# Patient Record
Sex: Female | Born: 1935 | Race: White | Hispanic: No | Marital: Single | Smoking: Never smoker
Health system: Southern US, Community
[De-identification: ages and names within clinical notes are randomized; demographics above are authoritative.]

## PROBLEM LIST (undated history)

## (undated) DIAGNOSIS — I10 Essential (primary) hypertension: Secondary | ICD-10-CM

## (undated) DIAGNOSIS — F329 Major depressive disorder, single episode, unspecified: Secondary | ICD-10-CM

## (undated) DIAGNOSIS — K559 Vascular disorder of intestine, unspecified: Secondary | ICD-10-CM

## (undated) DIAGNOSIS — I350 Nonrheumatic aortic (valve) stenosis: Secondary | ICD-10-CM

## (undated) DIAGNOSIS — F32A Depression, unspecified: Secondary | ICD-10-CM

## (undated) DIAGNOSIS — R55 Syncope and collapse: Secondary | ICD-10-CM

---

## 2005-12-04 ENCOUNTER — Ambulatory Visit: Payer: Self-pay | Admitting: Unknown Physician Specialty

## 2005-12-08 ENCOUNTER — Other Ambulatory Visit: Payer: Self-pay

## 2005-12-09 ENCOUNTER — Inpatient Hospital Stay: Payer: Self-pay | Admitting: Unknown Physician Specialty

## 2006-11-06 ENCOUNTER — Other Ambulatory Visit: Payer: Self-pay

## 2006-11-06 ENCOUNTER — Inpatient Hospital Stay: Payer: Self-pay | Admitting: Internal Medicine

## 2008-08-15 ENCOUNTER — Ambulatory Visit: Payer: Self-pay | Admitting: Family Medicine

## 2011-02-04 ENCOUNTER — Ambulatory Visit: Payer: Self-pay | Admitting: Family Medicine

## 2012-07-28 ENCOUNTER — Emergency Department: Payer: Self-pay | Admitting: Emergency Medicine

## 2013-06-29 ENCOUNTER — Emergency Department: Payer: Self-pay | Admitting: Emergency Medicine

## 2013-06-29 LAB — CBC
HGB: 13.1 g/dL (ref 12.0–16.0)
MCH: 31.9 pg (ref 26.0–34.0)
MCHC: 34.7 g/dL (ref 32.0–36.0)
MCV: 92 fL (ref 80–100)
RBC: 4.1 10*6/uL (ref 3.80–5.20)
WBC: 8.3 10*3/uL (ref 3.6–11.0)

## 2013-06-29 LAB — COMPREHENSIVE METABOLIC PANEL
Albumin: 3.2 g/dL — ABNORMAL LOW (ref 3.4–5.0)
Alkaline Phosphatase: 61 U/L (ref 50–136)
Anion Gap: 6 — ABNORMAL LOW (ref 7–16)
BUN: 19 mg/dL — ABNORMAL HIGH (ref 7–18)
Calcium, Total: 9.1 mg/dL (ref 8.5–10.1)
Chloride: 103 mmol/L (ref 98–107)
Co2: 24 mmol/L (ref 21–32)
Creatinine: 0.43 mg/dL — ABNORMAL LOW (ref 0.60–1.30)
EGFR (African American): >60
Glucose: 108 mg/dL — ABNORMAL HIGH (ref 65–99)
SGOT(AST): 22 U/L (ref 15–37)
SGPT (ALT): 19 U/L (ref 12–78)
Sodium: 133 mmol/L — ABNORMAL LOW (ref 136–145)

## 2013-06-29 LAB — URINALYSIS, COMPLETE
Bacteria: NONE SEEN
Glucose,UR: NEGATIVE mg/dL (ref 0–75)
Ph: 5 (ref 4.5–8.0)
Protein: NEGATIVE
WBC UR: <1 /HPF (ref 0–5)

## 2014-05-18 ENCOUNTER — Observation Stay: Payer: Self-pay | Admitting: Internal Medicine

## 2014-05-18 LAB — BASIC METABOLIC PANEL
ANION GAP: 4 — AB (ref 7–16)
BUN: 31 mg/dL — ABNORMAL HIGH (ref 7–18)
CREATININE: 0.66 mg/dL (ref 0.60–1.30)
Calcium, Total: 9.1 mg/dL (ref 8.5–10.1)
Chloride: 111 mmol/L — ABNORMAL HIGH (ref 98–107)
Co2: 26 mmol/L (ref 21–32)
EGFR (Non-African Amer.): >60
Glucose: 114 mg/dL — ABNORMAL HIGH (ref 65–99)
Osmolality: 289 (ref 275–301)
Potassium: 4.3 mmol/L (ref 3.5–5.1)
Sodium: 141 mmol/L (ref 136–145)

## 2014-05-18 LAB — CBC WITH DIFFERENTIAL/PLATELET
BASOS ABS: 0 10*3/uL (ref 0.0–0.1)
Basophil %: 0.6 %
Eosinophil #: 0.2 10*3/uL (ref 0.0–0.7)
Eosinophil %: 2.9 %
HCT: 40.2 % (ref 35.0–47.0)
HGB: 13.2 g/dL (ref 12.0–16.0)
LYMPHS ABS: 0.7 10*3/uL — AB (ref 1.0–3.6)
LYMPHS PCT: 12.2 %
MCH: 31.5 pg (ref 26.0–34.0)
MCHC: 32.9 g/dL (ref 32.0–36.0)
MCV: 96 fL (ref 80–100)
MONO ABS: 0.5 x10 3/mm (ref 0.2–0.9)
Monocyte %: 9.3 %
NEUTROS ABS: 4 10*3/uL (ref 1.4–6.5)
Neutrophil %: 75 %
Platelet: 156 10*3/uL (ref 150–440)
RBC: 4.2 10*6/uL (ref 3.80–5.20)
RDW: 13.6 % (ref 11.5–14.5)
WBC: 5.4 10*3/uL (ref 3.6–11.0)

## 2014-05-18 LAB — URINALYSIS, COMPLETE
Bilirubin,UR: NEGATIVE
Blood: NEGATIVE
Glucose,UR: NEGATIVE mg/dL (ref 0–75)
KETONE: NEGATIVE
Nitrite: NEGATIVE
Ph: 5 (ref 4.5–8.0)
Protein: NEGATIVE
Specific Gravity: 1.019 (ref 1.003–1.030)
Squamous Epithelial: 1
WBC UR: 1 /HPF (ref 0–5)

## 2014-05-18 LAB — TROPONIN I
Troponin-I: <0.02 ng/mL
Troponin-I: <0.02 ng/mL

## 2014-05-18 LAB — CK TOTAL AND CKMB (NOT AT ARMC)
CK, Total: 56 U/L
CK, Total: 41 U/L
CK-MB: 2.1 ng/mL (ref 0.5–3.6)
CK-MB: 1.7 ng/mL (ref 0.5–3.6)

## 2014-05-19 LAB — CBC WITH DIFFERENTIAL/PLATELET
Basophil #: 0 10*3/uL (ref 0.0–0.1)
Basophil %: 0.6 %
EOS ABS: 0.2 10*3/uL (ref 0.0–0.7)
EOS PCT: 3.9 %
HCT: 35.4 % (ref 35.0–47.0)
HGB: 11.8 g/dL — ABNORMAL LOW (ref 12.0–16.0)
LYMPHS ABS: 0.8 10*3/uL — AB (ref 1.0–3.6)
Lymphocyte %: 15.2 %
MCH: 31.8 pg (ref 26.0–34.0)
MCHC: 33.2 g/dL (ref 32.0–36.0)
MCV: 96 fL (ref 80–100)
MONOS PCT: 10.1 %
Monocyte #: 0.5 x10 3/mm (ref 0.2–0.9)
Neutrophil #: 3.5 10*3/uL (ref 1.4–6.5)
Neutrophil %: 70.2 %
Platelet: 129 10*3/uL — ABNORMAL LOW (ref 150–440)
RBC: 3.7 10*6/uL — AB (ref 3.80–5.20)
RDW: 13.1 % (ref 11.5–14.5)
WBC: 5 10*3/uL (ref 3.6–11.0)

## 2014-05-19 LAB — BASIC METABOLIC PANEL
ANION GAP: 1 — AB (ref 7–16)
BUN: 28 mg/dL — AB (ref 7–18)
CHLORIDE: 114 mmol/L — AB (ref 98–107)
CO2: 27 mmol/L (ref 21–32)
Calcium, Total: 8.5 mg/dL (ref 8.5–10.1)
Creatinine: 0.6 mg/dL (ref 0.60–1.30)
Glucose: 82 mg/dL (ref 65–99)
Osmolality: 288 (ref 275–301)
POTASSIUM: 4.1 mmol/L (ref 3.5–5.1)
Sodium: 142 mmol/L (ref 136–145)

## 2014-05-19 LAB — CK TOTAL AND CKMB (NOT AT ARMC)
CK, Total: 36 U/L
CK-MB: 1.9 ng/mL (ref 0.5–3.6)

## 2014-05-19 LAB — TROPONIN I: Troponin-I: <0.02 ng/mL

## 2014-08-23 ENCOUNTER — Ambulatory Visit: Payer: Self-pay | Admitting: Ophthalmology

## 2014-08-23 LAB — HEMOGLOBIN: HGB: 12.8 g/dL (ref 12.0–16.0)

## 2014-09-04 ENCOUNTER — Ambulatory Visit: Payer: Self-pay | Admitting: Ophthalmology

## 2014-09-14 ENCOUNTER — Observation Stay: Payer: Self-pay | Admitting: Internal Medicine

## 2014-09-14 LAB — TROPONIN I: Troponin-I: <0.02 ng/mL

## 2014-09-14 LAB — COMPREHENSIVE METABOLIC PANEL
ALBUMIN: 3.3 g/dL — AB (ref 3.4–5.0)
ANION GAP: 7 (ref 7–16)
AST: 31 U/L (ref 15–37)
Alkaline Phosphatase: 51 U/L
BUN: 22 mg/dL — ABNORMAL HIGH (ref 7–18)
Bilirubin,Total: 0.5 mg/dL (ref 0.2–1.0)
CO2: 26 mmol/L (ref 21–32)
Calcium, Total: 8.8 mg/dL (ref 8.5–10.1)
Chloride: 109 mmol/L — ABNORMAL HIGH (ref 98–107)
Creatinine: 0.37 mg/dL — ABNORMAL LOW (ref 0.60–1.30)
EGFR (African American): >60
Glucose: 85 mg/dL (ref 65–99)
Osmolality: 286 (ref 275–301)
Potassium: 4.3 mmol/L (ref 3.5–5.1)
SGPT (ALT): 35 U/L
Sodium: 142 mmol/L (ref 136–145)
TOTAL PROTEIN: 6 g/dL — AB (ref 6.4–8.2)

## 2014-09-14 LAB — CBC
HCT: 41.4 % (ref 35.0–47.0)
HGB: 13.6 g/dL (ref 12.0–16.0)
MCH: 31.6 pg (ref 26.0–34.0)
MCHC: 32.8 g/dL (ref 32.0–36.0)
MCV: 96 fL (ref 80–100)
PLATELETS: 175 10*3/uL (ref 150–440)
RBC: 4.29 10*6/uL (ref 3.80–5.20)
RDW: 13.6 % (ref 11.5–14.5)
WBC: 4.9 10*3/uL (ref 3.6–11.0)

## 2014-09-14 LAB — CK TOTAL AND CKMB (NOT AT ARMC)
CK, Total: 37 U/L
CK-MB: 1.9 ng/mL (ref 0.5–3.6)

## 2014-09-15 LAB — BASIC METABOLIC PANEL
Anion Gap: 4 — ABNORMAL LOW (ref 7–16)
BUN: 20 mg/dL — ABNORMAL HIGH (ref 7–18)
CHLORIDE: 111 mmol/L — AB (ref 98–107)
CO2: 29 mmol/L (ref 21–32)
Calcium, Total: 8 mg/dL — ABNORMAL LOW (ref 8.5–10.1)
Creatinine: 0.48 mg/dL — ABNORMAL LOW (ref 0.60–1.30)
EGFR (Non-African Amer.): >60
Glucose: 73 mg/dL (ref 65–99)
OSMOLALITY: 288 (ref 275–301)
POTASSIUM: 3.8 mmol/L (ref 3.5–5.1)
Sodium: 144 mmol/L (ref 136–145)

## 2014-09-15 LAB — TROPONIN I: Troponin-I: 0.02 ng/mL

## 2014-09-15 LAB — MAGNESIUM: Magnesium: 1.8 mg/dL

## 2015-02-24 NOTE — H&P (Signed)
PATIENT NAME:  Lisa Cantu, Lisa Cantu MR#:  [id number] DATE OF BIRTH:  [birth year]  DATE OF ADMISSION:  09/14/2014  PRIMARY CARE PHYSICIAN: Dr. Yates Decamp.  REFERRING PHYSICIAN: Loraine Leriche R. Fanny Bien, MD.   CHIEF COMPLAINT: Chest pain today.   HISTORY OF PRESENT ILLNESS: A 81 year old, Caucasian female with a history of hypertension and aortic stenosis who presented to the ED with chest pain this morning. The patient is alert, awake, oriented, in no acute distress. The patient started to have chest pain early in the morning which was sharp with no radiation, about 8/10. The patient denies any palpitations, orthopnea or nocturnal dyspnea. No leg edema. The patient denies any cough, shortness of breath or syncope episode.   PAST MEDICAL HISTORY: Childhood polio, osteoporosis, hypertension, depression, ischemic colitis, severe aortic stenosis.   PAST SURGICAL HISTORY: Left hip replacement, tonsillectomy, hysterectomy, appendectomy, kyphoplasty.   SOCIAL HISTORY: No smoking, drinking or illicit drugs.   FAMILY HISTORY: Mother and brother had heart disease. Father had cancer.   ALLERGIES: CODEINE, PENICILLIN, MERCURY.  HOME MEDICATIONS: Irbesartan  po daily, ferrous sulfate 325 mg p.o. at bedtime, escitalopram 10 mg p.o. daily, aspirin 81 mg p.o. daily, alendronate 10 mg p.o. once a week, acetaminophen extended release 2 tablets every 8 hours p.r.n.   REVIEW OF SYSTEMS:   CONSTITUTIONAL: The patient denies any fever or chills. No headache or dizziness. No weakness.   EYES: No double vision or blurred vision.   ENT: No postnasal drip, slurred speech or dysphagia.   CARDIOVASCULAR: Positive for chest pain. No palpitations, orthopnea or nocturnal dyspnea. No leg edema.   PULMONARY: No cough, sputum, shortness of breath or hematemesis.   GASTROINTESTINAL: No abdominal pain, nausea, vomiting, diarrhea. No melena or bloody stool.   GENITOURINARY: No dysuria, hematuria or incontinence.   SKIN: No  rash or jaundice.   NEUROLOGY: No syncope, loss of consciousness or seizure.   ENDOCRINOLOGY: No polyuria, polydipsia, heat or cold intolerance.   HEMATOLOGIC: No easy bruising or bleeding.   PHYSICAL EXAMINATION:  VITAL SIGNS: Temperature 98, blood pressure 124/106, pulse 56, respirations 18, oxygen saturation 98% on room air.   GENERAL: The patient is alert, awake, oriented, in no acute distress.   HEENT: Pupils round, equal and reactive to light and accommodation. Moist oral mucosa. Clear oropharynx.   NECK: Supple. No JVD or carotid bruit. No lymphadenopathy. No thyromegaly.   CARDIOVASCULAR: S1 and S2. Regular rate, rhythm. Systolic murmur about 3 to 4/6.  No gallop.  PULMONARY: Bilateral air entry. No wheezing or rales. No use of accessory muscles to breathe.   ABDOMEN: Soft. No distention or tenderness. No organomegaly. Bowel sounds present.   EXTREMITIES: No edema, clubbing or cyanosis. No calf tenderness. Bilateral pedal pulses present.   SKIN: No rash or jaundice.   NEUROLOGIC: A and O x 3. No focal deficit. Power 3 to 4/5. Sensation intact.   DIAGNOSTIC DATA: Chest x-ray: No acute cardiopulmonary abnormality. EKG showed sinus bradycardia with PACs in a pattern of bigeminy. Left bundle branch block.   LABORATORY DATA: Troponin less than 0.02. CBC in normal range. Glucose 85, BUN 22, creatinine 0.37. Electrolytes normal.   IMPRESSIONS: 1. Chest pain, need to rule out acute coronary syndrome.  2. History of severe aortic stenosis.  3. Hypertension.  4. Dehydration.   PLAN OF TREATMENT: 1. The patient will be placed for observation. We will continue to monitor, follow up troponin level and increase aspirin to 325 mg p.o. daily. We will get a cardiology consult  from Dr. Darrold JunkerParaschos.  2. For dehydration, we will give gentle rehydration and follow up BMP.  3. For hypertension, we will continue irbesartan.   4. I discussed the patient's condition and plan of treatment  with the patient.   CODE STATUS: The patient wants full code.   TIME SPENT: About 52 minutes.    ____________________________ Shaune PollackQing Clytee Heinrich, MD qc:TT D: 09/14/2014 19:26:51 ET T: 09/14/2014 19:54:57 ET JOB#: [id number]  cc: Shaune PollackQing Bentley Haralson, MD, <Dictator> Shaune PollackQING Marlayna Bannister MD ELECTRONICALLY SIGNED 09/15/2014 17:04

## 2015-02-24 NOTE — Discharge Summary (Signed)
PATIENT NAME:  Lisa Cantu, Lisa Cantu MR#:  [id number] DATE OF BIRTH:  10/29/1920  DATE OF ADMISSION:  09/14/2014 DATE OF DISCHARGE:  09/15/2014  DISCHARGE DIAGNOSES: 1.  Angina, due to severe aortic stenosis.  2.  Osteoporosis.  3.  Hypertension.  4.  Depression.  5.  Ischemic colitis.   DISCHARGE MEDICATIONS: Irbesartan 75 mg b.i.d., ferrous sulfate 325 mg daily, Lexapro 10 mg daily, aspirin 81 mg daily, alendronate 10 mg weekly, Tylenol 2 tabs q. 8 p.r.n.   REASON FOR ADMISSION: A 81 year old female who presents with chest pain. Please see H and P for HPI, past medical history and physical examination.  HOSPITAL COURSE: The patient was admitted. Enzymes were negative. The patient asymptomatic. It was thought that she was too hypotensive, causing chest pain with her AS. Her Irbesartan was decreased from 300 mg to 75 mg b.i.d. If she were to have any other symptoms, she would go to 75 mg daily, goal systolic 150/160. Follow up with Dr. Dan HumphreysWalker in 1 week.   ____________________________ Danella PentonMark F. Sandon Yoho, MD mfm:sb D: 09/15/2014 08:04:48 ET T: 09/15/2014 10:22:27 ET JOB#: [id number]  cc: Danella PentonMark F. Sherrie Marsan, MD, <Dictator> Thales Knipple Sherlene ShamsF Judyann Casasola MD ELECTRONICALLY SIGNED 09/17/2014 13:58

## 2015-02-24 NOTE — Consult Note (Signed)
Present Illness 81 year old female with history of severe aortic stenosis as well as pulmonary hypertension and tricuspid regurgitation with normal LV function and LVH by echo done recently as an outpatient.  She was admitted after suffering a syncopal episode which occurred while sitting on a cement slab.  She states she had no warning.  She was in the hot sun but states she had been eating and drinking well.  She felt not pain until she was being treated by paramedics.  She was brought to the hospital where she has ruled out for a myocardial infarction.  Brain CT scan did not reveal any significant abnormalities.  Carotid Dopplers looked normal.  Her electrolytes showed some mild dehydration.  She is currently asymptomatic on IV fluids.  Telemetry has revealed sinus arrhythmia with no tachy or bradyarrhythmias.  She denies any chest pain.  She suffered a left high a contusion.  There was no evidence of fracture. chest that revealed no acute cardiopulmonary abnormalities.   Physical Exam:  GEN thin   HEENT moist oral mucosa, Left  periorbital contusion   NECK No masses   RESP clear BS   CARD Regular rate and rhythm  Murmur   Murmur Systolic   Systolic Murmur Out flow   ABD denies tenderness   LYMPH negative neck, negative axillae   EXTR negative cyanosis/clubbing, negative edema   SKIN normal to palpation   NEURO cranial nerves intact, motor/sensory function intact   PSYCH A+O to time, place, person   Review of Systems:  Subjective/Chief Complaint left eye pain otherwise without complaints   General: No Complaints   Skin: No Complaints   ENT: No Complaints   Eyes: left periorbital contusion   Neck: No Complaints   Respiratory: No Complaints   Cardiovascular: No Complaints   Gastrointestinal: No Complaints   Genitourinary: No Complaints   Vascular: No Complaints   Musculoskeletal: No Complaints   Neurologic: No Complaints   Hematologic: No Complaints    Endocrine: No Complaints   Psychiatric: No Complaints   Review of Systems: All other systems were reviewed and found to be negative   Medications/Allergies Reviewed Medications/Allergies reviewed   Family & Social History:  Family and Social History:  Family History Non-Contributory   Social History negative tobacco   Place of Living Home   EKG:  Abnormal NSSTTW changes   Interpretation sinus arrhythmia with no ischemia    Codeine: GI Distress  Penicillin: Unknown  Mercury: Unknown   Impression 81 year old female with history of severe aortic stenosis and LV age she was admitted with a syncopal episode.  She has no evidence of arrhythmia or carotid disease.  Her episode occurred while sitting.  This did not appear to be exertional.  Does not appear that this was related to her aortic valve directly.  She may have had a tachy or bradyarrhythmia which was clinically more significant due to her fixed cardiac output.  She was also mildly volume depleted which may also have been playing a role.  There were no high-grade arrhythmias noted.  The will need to consider ambulation today.  Should she have no symptoms would consider discharge with placement of a  Holter monitor at the time of discharge with outpatient follow-up per Dr. Jamse Mead.   Plan 1. Continue with current meds 2. Ambulates later today and is stable consider discharge 3. Place 48 hr Holter monitor at time of discharge to determine if there is evidence of occult arrhythmia not noted during the hospitalization  4. Follow-up with Dr. Robbie LouisAlex's Paraschos 1 week after discharge   Electronic Signatures: Dalia HeadingFath, Maykayla Highley A (MD)  (Signed 17-Jul-15 09:00)  Authored: General Aspect/Present Illness, History and Physical Exam, Review of System, Family & Social History, EKG , Allergies, Impression/Plan   Last Updated: 17-Jul-15 09:00 by Dalia HeadingFath, Analis Distler A (MD)

## 2015-02-24 NOTE — H&P (Signed)
PATIENT NAME:  Lisa Cantu, Lisa Cantu MR#:  [id number] DATE OF BIRTH:  23-May-1920  DATE OF ADMISSION:  05/18/2014  CHIEF COMPLAINT: Syncope.   HISTORY OF PRESENT ILLNESS: Lisa Cantu is a 81 year old female, who is a patient of Dr. Yates DecampJohn Walker, who presented to the ED after she passed out. The patient reportedly was at her great niece's house by the pool, sitting on a cement ledge when she passed out. She also hit her head against the cement and sustained a laceration to the left temple and an abrasion to the left elbow. The patient denies any chest pain or shortness of breath. No dizziness. No seizures. No incontinence of urine or stool. She states that she was somewhat confused after the event. She has been having a headache mainly in the left temple area.   PAST MEDICAL HISTORY: Significant for childhood polio, osteoporosis, hypertension, depression, history of ischemic colitis, severe aortic stenosis with with a mean gradient of 36 mmHg by echocardiogram in 2012, history of elevated fasting glucose.   PAST SURGICAL HISTORY: Significant for left hip replacement, tonsillectomy, hysterectomy, appendectomy and kyphoplasty.   CURRENT MEDICATIONS: Fosamax 70 mg once a week, aspirin 81 mg a day, Irbesartan 300 mg once a day, ferrous sulfate 325 mg once a day, escitalopram 10 mg once a day.   ALLERGIES: PENICILLIN.   SOCIAL HISTORY: The patient denies any use of tobacco.   FAMILY HISTORY: Mother and brother had heart disease. Father had cancer.   SOCIAL HISTORY: The patient is unmarried, no children.   PHYSICAL EXAMINATION:  VITAL SIGNS: Temperature was 98.2, pulse was 62, blood pressure 131/60, pulse oximetry 96% on room air. Height 4 feet 11 inches.  GENERAL: She was not in distress. Laceration noted in the left temporal area, and abrasion to left elbow.  HEENT: Pupils equal and reactive to light.  NECK: No JVD.  LUNGS: Kyphotic, chest clear to auscultation.  HEART: S1, S2. A 3/6 systolic  ejection murmur noted.  ABDOMEN: Soft, nontender.  EXTREMITIES: No edema. Evidence of muscle weakness from polio noted in both lower extremities, right worse than left, and arthritic changes noted in both upper extremities.   LABORATORY DATA: WBC count 5.4, hemoglobin 13.2, hematocrit 40.2, platelets 156,000. Sugar 114, BUN 31, creatinine 0.66, potassium 4.3, chloride 111, CO2 of 26, anion gap 4. Urinalysis normal.   CT head showed mild diffuse cortical atrophy and mild chronic ischemic white matter disease. No acute intracranial abnormality seen. Old fracture involving the odontoid process is noted without significant posterior displacement or spondylolisthesis. There was multilevel degenerative disk disease. No acute abnormalities seen in the cervical spine.   EKG showed evidence of sinus bradycardia with sinus arrhythmia, possible left atrial enlargement, ST-T changes noted in the inferolateral leads with Q waves in lead III and aVF.   Troponin was less than 0.02.   IMPRESSION:  1. Syncope, may be related to aortic stenosis.  2. Dehydration with elevated BUN.  3. History of hypertension.  4. History of anxiety and depression.   PLAN: Admit the patient for observation. We will obtain a carotid duplex ultrasound. We will also cycle cardiac enzymes. Consult cardiology and gently hydrate with IV normal saline.   TOTAL TIME SPENT IN ADMISSION OF THIS PATIENT: 35 minutes.    ____________________________ Barbette ReichmannVishwanath Shakiara Lukic, MD vh:jr D: 05/18/2014 18:17:14 ET T: 05/18/2014 18:41:06 ET JOB#: [id number]  cc: Barbette ReichmannVishwanath Dequavion Follette, MD, <Dictator> Barbette ReichmannVISHWANATH Johnae Friley MD ELECTRONICALLY SIGNED 05/29/2014 18:16

## 2015-02-24 NOTE — Discharge Summary (Signed)
PATIENT NAME:  Lisa Cantu, [name] MR#:  [id number] DATE OF BIRTH:  1920-06-06  DATE OF ADMISSION:  05/18/2014 DATE OF DISCHARGE:  05/19/2014  DISCHARGE DIAGNOSES: 1.  Syncope.  2.  Severe aortic stenosis.  3.  Fall with abrasion to left elbow. 4.  Laceration to left temple.  5.  Hypertension.   CHIEF COMPLAINT: Passed out.   HISTORY OF PRESENT ILLNESS: Lisa Cantu is a 81 year old female who presented to ED after she passed out. The patient was reportedly at her great niece's house when she passed out and hit her head against the cement and sustained a laceration to the left temple and also had abrasion to left elbow.   PAST MEDICAL HISTORY: Significant for severe aortic stenosis, left hip replacement, tonsillectomy, hysterectomy, appendectomy and kyphoplasty.   HOSPITAL COURSE: [facility] and observed overnight. She received intravenous fluids. She also underwent an echocardiogram and a carotid ultrasound.  Carotid ultrasound did not show any hemodynamically significant stenosis. There was area of relatively mild plaque bilaterally slightly more left than the right. No stenosis approaching 50% was seen by real-time interrogation on either side. Flow in both vertebral arteries in the direction.   Echocardiogram showed ejection fraction of 55% to 60%, normal global LV systolic function, moderate concentric LVH, moderately increased left ventricular septal thickness, moderate thickening and calcification anterior and posterior mitral valve, severe aortic valve stenosis, mild to moderate tricuspid regurgitation and moderate increased LV posterior wall thickness. The patient was stable during her stay in the hospital and did not have any further episodes of syncope. She was ambulated and appeared stable and was discharged on the following medications. She was advised to have a Holter monitor test placed and also follow with Dr. Darrold JunkerParaschos in 1 week.   DISCHARGE MEDICATIONS:   Escitalopram 10 mg once a day, ferrous sulfate 325, one tablet once a day, irbesartan 300 mg once a day, aspirin 81 mg a day, Fosamax 70 mg once a week.   FOLLOWUP: The patient has been advised to follow up with Dr. Dan HumphreysWalker in 1-2 weeks' time. She was advised to call back or report to the ER if she has any further episodes. She was stable at the time of discharge.   TOTAL TIME SPENT ON DISCHARGING PATIENT: 35 minutes.    ____________________________ Barbette ReichmannVishwanath Symphany Fleissner, MD vh:ds D: 05/19/2014 15:55:58 ET T: 05/19/2014 22:50:02 ET JOB#: [id number]  cc: Barbette ReichmannVishwanath Maneh Sieben, MD, <Dictator> Barbette ReichmannVISHWANATH Claris Pech MD ELECTRONICALLY SIGNED 05/29/2014 18:16

## 2015-02-24 NOTE — Op Note (Signed)
PATIENT NAME:  Lisa Cantu, Lisa Cantu MR#:  [id number] DATE OF BIRTH:  [birth year]  DATE OF PROCEDURE:  09/04/2014  PREOPERATIVE DIAGNOSIS: Cataract, left eye.   POSTOPERATIVE DIAGNOSIS: Cataract, left eye.   PROCEDURE PERFORMED: Extracapsular cataract extraction using phacoemulsification with placement Alcon [id number], 21.0 diopter posterior chamber lens, serial number [id number].   SURGEON: Maylon PeppersSteven A. Dewey Viens, MD  ASSISTANT:  None.   ANESTHESIA: Was 4% lidocaine and 0.75% Marcaine, a 50-50 mixture with 10 units/mL of Hylenex added, given as a peribulbar.  ANESTHESIOLOGIST: Yves DillPaul Carroll, MD   COMPLICATIONS: None.   DESCRIPTION OF PROCEDURE:  The patient was brought to the operating room and given a peribulbar block.  The patient was then prepped and draped in the usual fashion.  The vertical rectus muscles were imbricated using 5-0 silk sutures.  These sutures were then clamped to the sterile drapes as bridle sutures.  A limbal peritomy was performed extending two clock hours and hemostasis was obtained with cautery.  A partial thickness scleral groove was made at the surgical limbus and dissected anteriorly in a lamellar dissection using an Alcon crescent knife.  The anterior chamber was entered superonasally with a Superblade and through the lamellar dissection with a 2.6 mm keratome.  DisCoVisc was used to replace the aqueous and a continuous tear capsulorrhexis was carried out.  Hydrodissection and hydrodelineation were carried out with balanced salt and a 27 gauge canula.  The nucleus was rotated to confirm the effectiveness of the hydrodissection.  Phacoemulsification was carried out using a divide-and-conquer technique.  Total ultrasound time was 1 minute and 13 seconds with an average power of 23.8%, with CDE of 30.49. No sutures placed.  Irrigation/aspiration was used to remove the residual cortex.  DisCoVisc was used to inflate the capsule and the internal incision was enlarged to 3 mm  with the crescent knife.  The intraocular lens was folded and inserted into the capsular bag using the AcrySert delivery system.   Irrigation/aspiration was used to remove the residual DisCoVisc.  Miostat was injected into the anterior chamber through the paracentesis track to inflate the anterior chamber and induce miosis. A tenth of a mL of Vigamox containing 0.1 mg of drug was injected via he paracentesis track.    The wound was checked for leaks and none were found. The conjunctiva was closed with cautery and the bridle sutures were removed.  Two drops of 0.3% Vigamox were placed on the eye.   An eye shield was placed on the eye.  The patient was discharged to the recovery room in good condition.    ____________________________ Maylon PeppersSteven A. Deondre Marinaro, MD sad:ts D: 09/04/2014 13:26:18 ET T: 09/04/2014 22:04:02 ET JOB#: [id number]  cc: Viviann SpareSteven A. Jaleil Renwick, MD, <Dictator> Erline LevineSTEVEN A Gimena Buick MD ELECTRONICALLY SIGNED 09/11/2014 13:25

## 2015-06-08 IMAGING — CT CT CERVICAL SPINE WITHOUT CONTRAST
3 of 4 series · 12 of 27 positions shown, 14 images · non-contrast
Comparison: CT scan of head and cervical spine July 28, 2012.

CLINICAL DATA: Head injury, syncope.

EXAM:
CT HEAD WITHOUT CONTRAST
CT CERVICAL SPINE WITHOUT CONTRAST
TECHNIQUE: Multidetector CT imaging of the head and cervical spine was
performed following the standard protocol without intravenous
contrast. Multiplanar CT image reconstructions of the cervical spine
were also generated.

[Series 3: head bone · axial · 0.41mm/px · z∈[-128,-23]mm · 5 of 106 slices shown]
[im 18/106  bone]
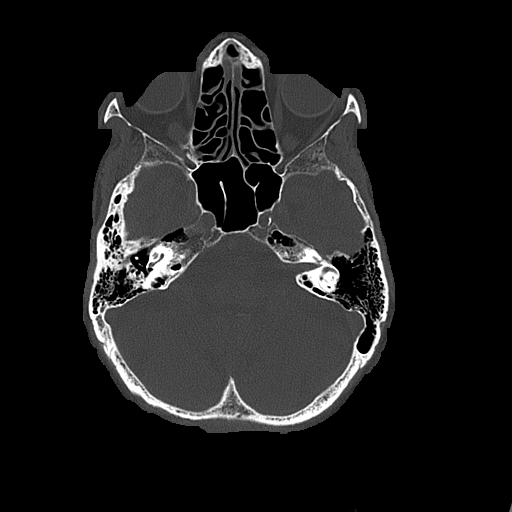
[im 36/106  bone]
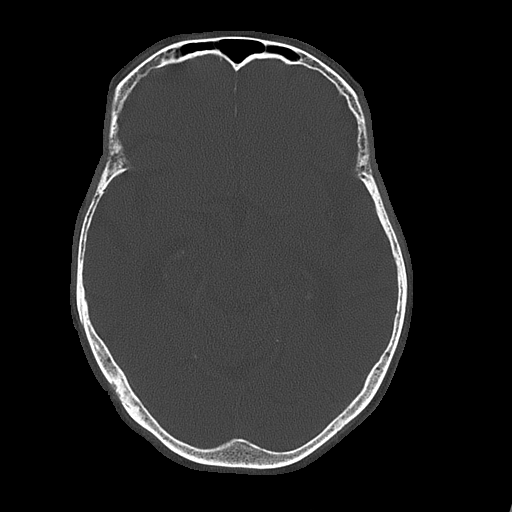
[im 53/106  bone]
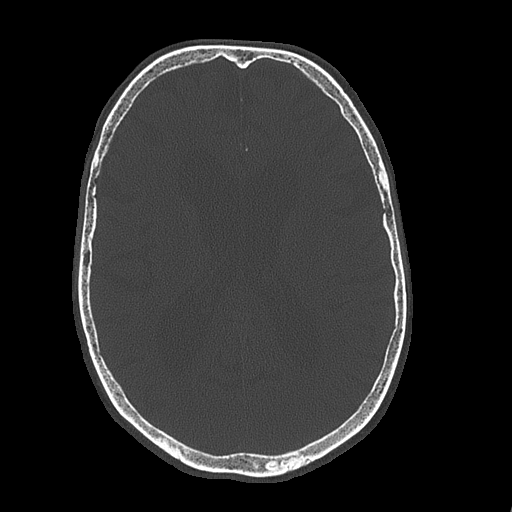
[im 71/106  bone]
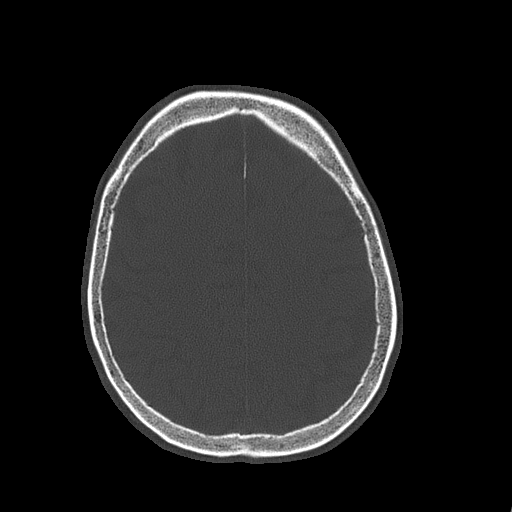
[im 88/106  bone]
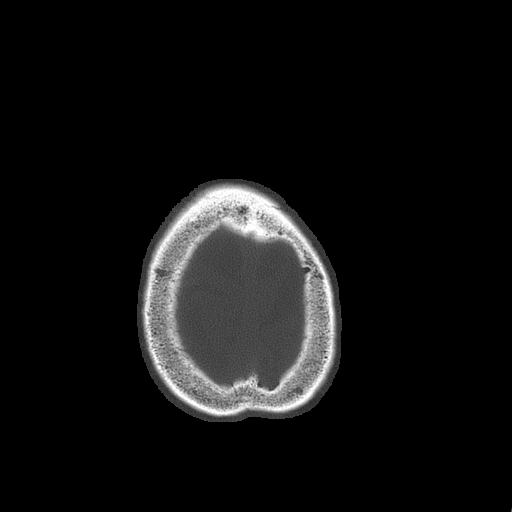

[Series 5: c spine soft · axial · 0.44mm/px · z∈[-236,-194]mm · 2 of 64 slices shown, 3 images]
[im 22/64  soft-tissue]
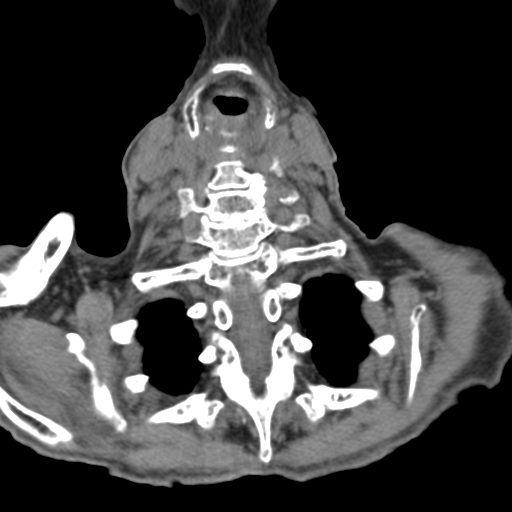
[im 22/64  bone]
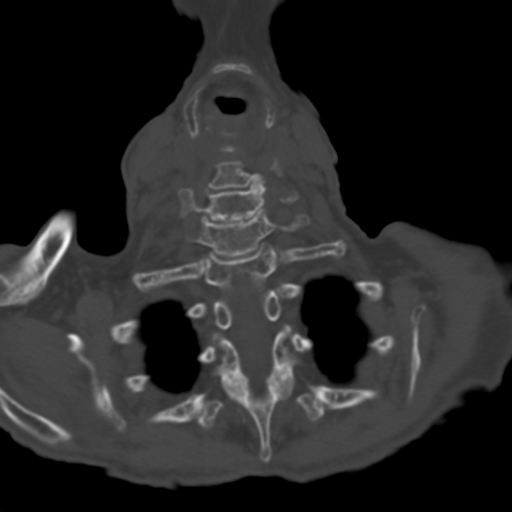
[im 43/64  bone]
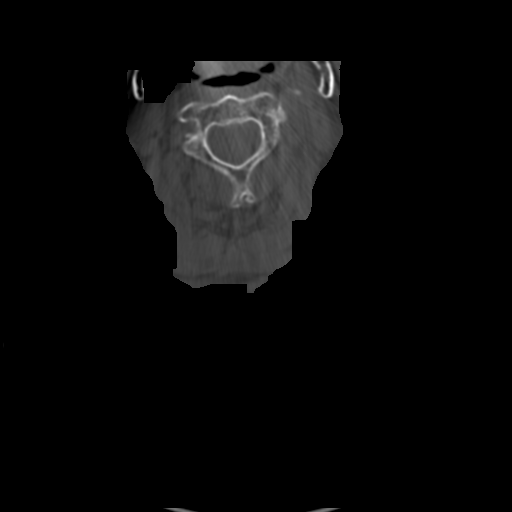

[Series 6: sag bone · sagittal · 0.36mm/px · 5 of 61 slices shown, 6 images]
[im 21/61  bone]
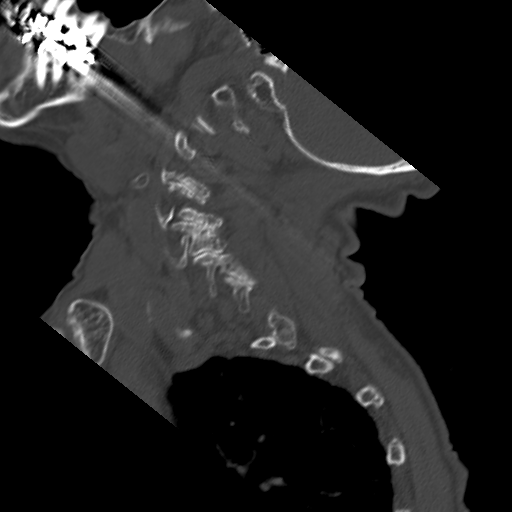
[im 26/61  bone]
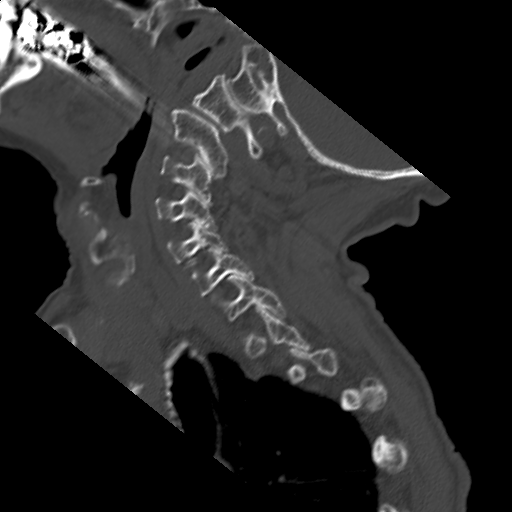
[im 31/61  soft-tissue]
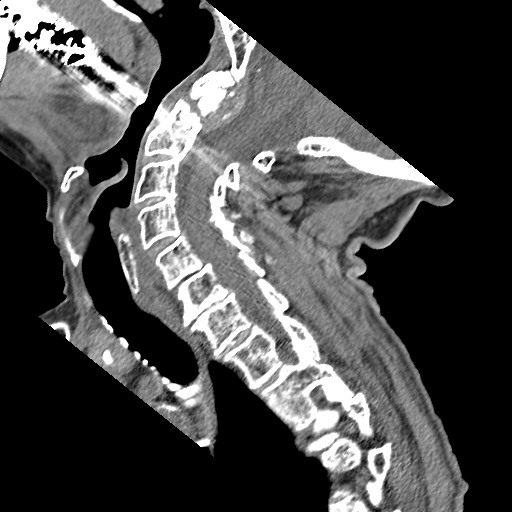
[im 31/61  bone]
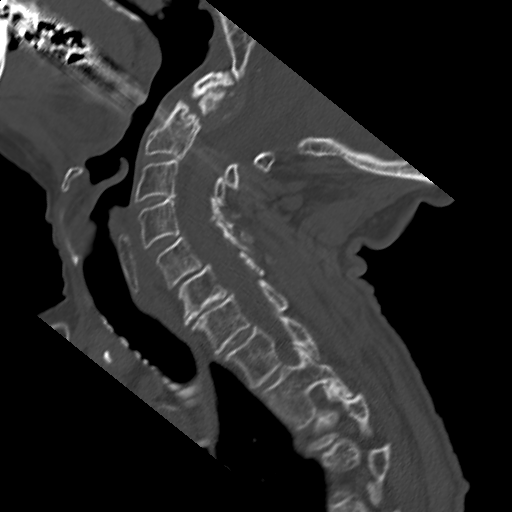
[im 36/61  bone]
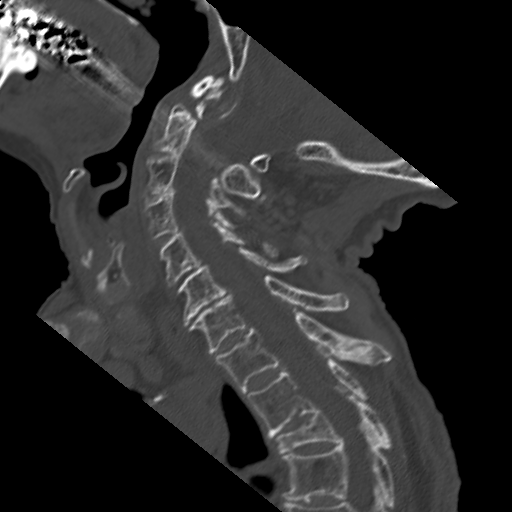
[im 41/61  bone]
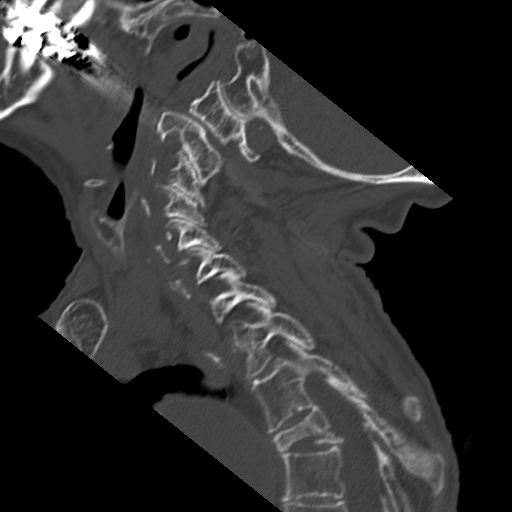

[12 of 27 positions shown; findings below may reference images not displayed]

FINDINGS: CT HEAD FINDINGS

Bony calvarium is intact. Mild diffuse cortical atrophy is noted.
Mild chronic ischemic white matter disease is noted. No mass effect
or midline shift is noted. Ventricular size is within normal limits.
There is no evidence of mass lesion, hemorrhage or acute infarction.

CT CERVICAL SPINE FINDINGS

Old fracture involving the odontoid is noted without posterior
displacement or significant spondylolisthesis. Severe degenerative
disc disease is noted at C5-6 and C6-7 with anterior osteophyte
formation. No acute fracture is noted. Degenerative changes seen
involving the posterior facet joints bilaterally and at multiple
levels.
IMPRESSION: Mild diffuse cortical atrophy. Mild chronic ischemic white matter
disease. No acute intracranial abnormality seen.

Old fracture involving the odontoid process is noted without
significant posterior displacement or spondylolisthesis. Multilevel
degenerative disc disease is noted. No acute abnormality seen in the
cervical spine.

## 2015-09-18 ENCOUNTER — Emergency Department
Admission: EM | Admit: 2015-09-18 | Discharge: 2015-09-18 | Disposition: A | Discharge status: Home / Self Care | Payer: Medicare Other | Source: Home / Self Care | Attending: Emergency Medicine | Admitting: Emergency Medicine

## 2015-09-18 ENCOUNTER — Emergency Department: Payer: Medicare Other

## 2015-09-18 ENCOUNTER — Encounter: Payer: Self-pay | Admitting: *Deleted

## 2015-09-18 DIAGNOSIS — I214 Non-ST elevation (NSTEMI) myocardial infarction: Secondary | ICD-10-CM | POA: Diagnosis not present

## 2015-09-18 DIAGNOSIS — R531 Weakness: Secondary | ICD-10-CM

## 2015-09-18 DIAGNOSIS — J189 Pneumonia, unspecified organism: Secondary | ICD-10-CM

## 2015-09-18 HISTORY — DX: Depression, unspecified: F32.A

## 2015-09-18 HISTORY — DX: Major depressive disorder, single episode, unspecified: F32.9

## 2015-09-18 HISTORY — DX: Syncope and collapse: R55

## 2015-09-18 HISTORY — DX: Vascular disorder of intestine, unspecified: K55.9

## 2015-09-18 HISTORY — DX: Essential (primary) hypertension: I10

## 2015-09-18 HISTORY — DX: Nonrheumatic aortic (valve) stenosis: I35.0

## 2015-09-18 LAB — URINALYSIS COMPLETE WITH MICROSCOPIC (ARMC ONLY)
BACTERIA UA: NONE SEEN
Bilirubin Urine: NEGATIVE
Glucose, UA: NEGATIVE mg/dL
HGB URINE DIPSTICK: NEGATIVE
LEUKOCYTES UA: NEGATIVE
Nitrite: NEGATIVE
PH: 5 (ref 5.0–8.0)
PROTEIN: 30 mg/dL — AB
RBC / HPF: NONE SEEN RBC/hpf (ref 0–5)
Specific Gravity, Urine: 1.023 (ref 1.005–1.030)

## 2015-09-18 LAB — CBC WITH DIFFERENTIAL/PLATELET
BASOS ABS: 0 10*3/uL (ref 0–0.1)
Basophils Relative: 0 %
Eosinophils Absolute: 0.1 10*3/uL (ref 0–0.7)
Eosinophils Relative: 1 %
HEMATOCRIT: 43.4 % (ref 35.0–47.0)
Hemoglobin: 14.1 g/dL (ref 12.0–16.0)
LYMPHS PCT: 8 %
Lymphs Abs: 0.6 10*3/uL — ABNORMAL LOW (ref 1.0–3.6)
MCH: 30.8 pg (ref 26.0–34.0)
MCHC: 32.6 g/dL (ref 32.0–36.0)
MCV: 94.6 fL (ref 80.0–100.0)
MONO ABS: 0.8 10*3/uL (ref 0.2–0.9)
MONOS PCT: 10 %
NEUTROS ABS: 5.9 10*3/uL (ref 1.4–6.5)
Neutrophils Relative %: 81 %
Platelets: 171 10*3/uL (ref 150–440)
RBC: 4.59 MIL/uL (ref 3.80–5.20)
RDW: 15.1 % — AB (ref 11.5–14.5)
WBC: 7.3 10*3/uL (ref 3.6–11.0)

## 2015-09-18 LAB — COMPREHENSIVE METABOLIC PANEL
ALBUMIN: 3.5 g/dL (ref 3.5–5.0)
ALT: 39 U/L (ref 14–54)
ANION GAP: 7 (ref 5–15)
AST: 44 U/L — ABNORMAL HIGH (ref 15–41)
Alkaline Phosphatase: 45 U/L (ref 38–126)
BUN: 20 mg/dL (ref 6–20)
CHLORIDE: 106 mmol/L (ref 101–111)
CO2: 26 mmol/L (ref 22–32)
Calcium: 9.5 mg/dL (ref 8.9–10.3)
Creatinine, Ser: 0.63 mg/dL (ref 0.44–1.00)
GFR calc Af Amer: >60 mL/min (ref >60)
GFR calc non Af Amer: >60 mL/min (ref >60)
GLUCOSE: 102 mg/dL — AB (ref 65–99)
POTASSIUM: 3.8 mmol/L (ref 3.5–5.1)
SODIUM: 139 mmol/L (ref 135–145)
Total Bilirubin: 0.7 mg/dL (ref 0.3–1.2)
Total Protein: 6.1 g/dL — ABNORMAL LOW (ref 6.5–8.1)

## 2015-09-18 MED ORDER — LEVOFLOXACIN IN D5W 750 MG/150ML IV SOLN
750.0000 mg | Freq: Once | INTRAVENOUS | Status: AC
  Administered 2015-09-18: 750 mg via INTRAVENOUS
  Filled 2015-09-18: qty 150

## 2015-09-18 MED ORDER — SODIUM CHLORIDE 0.9 % IV BOLUS (SEPSIS)
500.0000 mL | Freq: Once | INTRAVENOUS | Status: AC
  Administered 2015-09-18: 500 mL via INTRAVENOUS

## 2015-09-18 MED ORDER — LEVOFLOXACIN 750 MG PO TABS: 750.0000 mg | ORAL_TABLET | Freq: Every day | ORAL | Status: DC

## 2015-09-18 NOTE — ED Provider Notes (Signed)
[facility] Emergency Department Provider Note   ____________________________________________  Time seen: 1655  I have reviewed the triage vital signs and the nursing notes.   HISTORY  Chief Complaint Weakness   History limited by: Not Limited   HPI Lisa Cantu is a 81 y.o. female who presents to the emergency department today because of concerns for weakness. The patient states that this weakness has been going on for roughly 5 weeks. It is been progressively getting worse. Were recently however the patient is concerned about some chest congestion. She has had some cough. Patient denies any shortness of breath. Denies any significant chest pain. Denies any fevers.   Past Medical History  Diagnosis Date  . Aortic stenosis   . Hypertension   . Depressed   . Syncope   . Ischemic colitis (HCC)     There are no active problems to display for this patient.   History reviewed. No pertinent past surgical history.  No current outpatient prescriptions on file.  Allergies Penicillins  No family history on file.  Social History Social History  Substance Use Topics  . Smoking status: Never Smoker   . Smokeless tobacco: None  . Alcohol Use: No    Review of Systems  Constitutional: Negative for fever. Cardiovascular: Negative for chest pain. Respiratory: Negative for shortness of breath. Gastrointestinal: Negative for abdominal pain, vomiting and diarrhea. Genitourinary: Negative for dysuria. Musculoskeletal: Negative for back pain. Skin: Negative for rash. Neurological: Negative for headaches, focal weakness or numbness.  10-point ROS otherwise negative.  ____________________________________________   PHYSICAL EXAM:  VITAL SIGNS: ED Triage Vitals  Enc Vitals Group     BP 09/18/15 1550 133/66 mmHg     Pulse Rate 09/18/15 1550 73     Resp 09/18/15 1550 20     Temp 09/18/15 1550 98.1 F (36.7 C)     Temp Source 09/18/15 1550  Oral     SpO2 09/18/15 1550 97 %     Weight 09/18/15 1550 75 lb (34.02 kg)     Height 09/18/15 1550 4\' 6"  (1.372 m)     Head Cir --      Peak Flow --      Pain Score 09/18/15 1551 5   Constitutional: Alert and oriented. Well appearing and in no distress. Eyes: Conjunctivae are normal. PERRL. Normal extraocular movements. ENT   Head: Normocephalic and atraumatic.   Nose: No congestion/rhinnorhea.   Mouth/Throat: Mucous membranes are moist.   Neck: No stridor. Hematological/Lymphatic/Immunilogical: No cervical lymphadenopathy. Cardiovascular: Normal rate, regular rhythm.  Systolic murmur grade 2/6. Respiratory: Normal respiratory effort without tachypnea nor retractions. Breath sounds are clear and equal bilaterally. No wheezes/rales/rhonchi. Gastrointestinal: Soft and nontender. No distention.  Genitourinary: Deferred Musculoskeletal: Normal range of motion in all extremities. No joint effusions.  No lower extremity tenderness nor edema. Neurologic:  Normal speech and language. No gross focal neurologic deficits are appreciated.  Skin:  Skin is warm, dry and intact. No rash noted. Psychiatric: Mood and affect are normal. Speech and behavior are normal. Patient exhibits appropriate insight and judgment.  ____________________________________________    LABS (pertinent positives/negatives)  Labs Reviewed  COMPREHENSIVE METABOLIC PANEL - Abnormal; Notable for the following:    Glucose, Bld 102 (*)    Total Protein 6.1 (*)    AST 44 (*)    All other components within normal limits  CBC WITH DIFFERENTIAL/PLATELET - Abnormal; Notable for the following:    RDW 15.1 (*)    Lymphs Abs 0.6 (*)  All other components within normal limits  URINALYSIS COMPLETEWITH MICROSCOPIC ([facility] ONLY) - Abnormal; Notable for the following:    Color, Urine YELLOW (*)    APPearance CLEAR (*)    Ketones, ur TRACE (*)    Protein, ur 30 (*)    Squamous Epithelial / LPF 0-5 (*)    All other  components within normal limits  TROPONIN I     ____________________________________________   EKG  I, Phineas Semen, attending physician, personally viewed and interpreted this EKG  EKG Time: 1617 Rate: 76 Rhythm: NSR Axis: left axis deviation Intervals: qtc 474 QRS: LBBB ST changes: no st elevation Impression: abnormal ekg ____________________________________________    RADIOLOGY  CXR IMPRESSION: Consolidation over the left lower lobe likely pneumonia. Small left effusion.  ____________________________________________   PROCEDURES  Procedure(s) performed: None  Critical Care performed: No  ____________________________________________   INITIAL IMPRESSION / ASSESSMENT AND PLAN / ED COURSE  Pertinent labs & imaging results that were available during my care of the patient were reviewed by me and considered in my medical decision making (see chart for details).  Patient presents to the emergency department today because of weakness progressively getting worse for 4-5 months and some concern of chest congestion. Chest x-ray shows a possible pneumonia. Patient however is afebrile without a leukocytosis. At this point I'm not 100% convinced that the clinical picture matches pneumonia. The patient did state she felt better after IV fluids think dehydration likely playing a large part of the patient's symptoms. Will plan however on sending home with antibiotics in case there is a pneumonia. Discussed follow-up and return precautions.  ____________________________________________   FINAL CLINICAL IMPRESSION(S) / ED DIAGNOSES  Final diagnoses:  Community acquired pneumonia  Weakness     Phineas Semen, MD 09/18/15 [phone]

## 2015-09-18 NOTE — Discharge Instructions (Signed)
Please seek medical attention for any high fevers, chest pain, shortness of breath, change in behavior, persistent vomiting, bloody stool or any other new or concerning symptoms. ° °Community-Acquired Pneumonia, Adult °Pneumonia is an infection of the lungs. There are different types of pneumonia. One type can develop while a person is in a hospital. A different type, called community-acquired pneumonia, develops in people who are not, or have not recently been, in the hospital or other health care facility.  °CAUSES °Pneumonia may be caused by bacteria, viruses, or funguses. Community-acquired pneumonia is often caused by Streptococcus pneumonia bacteria. These bacteria are often passed from one person to another by breathing in droplets from the cough or sneeze of an infected person. °RISK FACTORS °The condition is more likely to develop in: °· People who have chronic diseases, such as chronic obstructive pulmonary disease (COPD), asthma, congestive heart failure, cystic fibrosis, diabetes, or kidney disease. °· People who have early-stage or late-stage HIV. °· People who have sickle cell disease. °· People who have had their spleen removed (splenectomy). °· People who have poor dental hygiene. °· People who have medical conditions that increase the risk of breathing in (aspirating) secretions their own mouth and nose.   °· People who have a weakened immune system (immunocompromised). °· People who smoke. °· People who travel to areas where pneumonia-causing germs commonly exist. °· People who are around animal habitats or animals that have pneumonia-causing germs, including birds, bats, rabbits, cats, and farm animals. °SYMPTOMS °Symptoms of this condition include: °· A dry cough. °· A wet (productive) cough. °· Fever. °· Sweating. °· Chest pain, especially when breathing deeply or coughing. °· Rapid breathing or difficulty breathing. °· Shortness of breath. °· Shaking chills. °· Fatigue. °· Muscle  aches. °DIAGNOSIS °Your health care provider will take a medical history and perform a physical exam. You may also have other tests, including: °· Imaging studies of your chest, including X-rays. °· Tests to check your blood oxygen level and other blood gases. °· Other tests on blood, mucus (sputum), fluid around your lungs (pleural fluid), and urine. °If your pneumonia is severe, other tests may be done to identify the specific cause of your illness. °TREATMENT °The type of treatment that you receive depends on many factors, such as the cause of your pneumonia, the medicines you take, and other medical conditions that you have. For most adults, treatment and recovery from pneumonia may occur at home. In some cases, treatment must happen in a hospital. Treatment may include: °· Antibiotic medicines, if the pneumonia was caused by bacteria. °· Antiviral medicines, if the pneumonia was caused by a virus. °· Medicines that are given by mouth or through an IV tube. °· Oxygen. °· Respiratory therapy. °Although rare, treating severe pneumonia may include: °· Mechanical ventilation. This is done if you are not breathing well on your own and you cannot maintain a safe blood oxygen level. °· Thoracentesis. This procedure removes fluid around one lung or both lungs to help you breathe better. °HOME CARE INSTRUCTIONS °· Take over-the-counter and prescription medicines only as told by your health care provider. °¨ Only take cough medicine if you are losing sleep. Understand that cough medicine can prevent your body's natural ability to remove mucus from your lungs. °¨ If you were prescribed an antibiotic medicine, take it as told by your health care provider. Do not stop taking the antibiotic even if you start to feel better. °· Sleep in a semi-upright position at night. Try sleeping in a reclining chair, or place a few pillows under your head. °· Do   not use tobacco products, including cigarettes, chewing tobacco, and  e-cigarettes. If you need help quitting, ask your health care provider. °· Drink enough water to keep your urine clear or pale yellow. This will help to thin out mucus secretions in your lungs. °PREVENTION °There are ways that you can decrease your risk of developing community-acquired pneumonia. Consider getting a pneumococcal vaccine if: °· You are older than 79 years of age. °· You are older than 79 years of age and are undergoing cancer treatment, have chronic lung disease, or have other medical conditions that affect your immune system. Ask your health care provider if this applies to you. °There are different types and schedules of pneumococcal vaccines. Ask your health care provider which vaccination option is best for you. °You may also prevent community-acquired pneumonia if you take these actions: °· Get an influenza vaccine every year. Ask your health care provider which type of influenza vaccine is best for you. °· Go to the dentist on a regular basis. °· Wash your hands often. Use hand sanitizer if soap and water are not available. °SEEK MEDICAL CARE IF: °· You have a fever. °· You are losing sleep because you cannot control your cough with cough medicine. °SEEK IMMEDIATE MEDICAL CARE IF: °· You have worsening shortness of breath. °· You have increased chest pain. °· Your sickness becomes worse, especially if you are an older adult or have a weakened immune system. °· You cough up blood. °  °This information is not intended to replace advice given to you by your health care provider. Make sure you discuss any questions you have with your health care provider. °  °Document Released: 10/20/2005 Document Revised: 07/11/2015 Document Reviewed: 02/14/2015 °Elsevier Interactive Patient Education ©2016 [employer] ° °

## 2015-09-18 NOTE — ED Notes (Signed)
Pt medication not stocked in ED pyxis.  Called pharmacy to send medication.

## 2015-09-18 NOTE — ED Notes (Signed)
Head chest congestion, black stools, saw md yesterday , is on iron, no rx, no dx

## 2015-09-19 ENCOUNTER — Inpatient Hospital Stay
Admission: EM | Admit: 2015-09-19 | Discharge: 2015-09-21 | DRG: 280 | Disposition: A | Discharge status: Hospice | Payer: Medicare Other | Attending: Internal Medicine | Admitting: Internal Medicine

## 2015-09-19 ENCOUNTER — Emergency Department: Payer: Medicare Other

## 2015-09-19 ENCOUNTER — Encounter: Payer: Self-pay | Admitting: Radiology

## 2015-09-19 DIAGNOSIS — A419 Sepsis, unspecified organism: Secondary | ICD-10-CM | POA: Diagnosis present

## 2015-09-19 DIAGNOSIS — I5022 Chronic systolic (congestive) heart failure: Secondary | ICD-10-CM | POA: Diagnosis present

## 2015-09-19 DIAGNOSIS — Y95 Nosocomial condition: Secondary | ICD-10-CM | POA: Diagnosis present

## 2015-09-19 DIAGNOSIS — R7989 Other specified abnormal findings of blood chemistry: Secondary | ICD-10-CM

## 2015-09-19 DIAGNOSIS — I110 Hypertensive heart disease with heart failure: Secondary | ICD-10-CM | POA: Diagnosis present

## 2015-09-19 DIAGNOSIS — I350 Nonrheumatic aortic (valve) stenosis: Secondary | ICD-10-CM | POA: Diagnosis present

## 2015-09-19 DIAGNOSIS — I214 Non-ST elevation (NSTEMI) myocardial infarction: Principal | ICD-10-CM | POA: Diagnosis present

## 2015-09-19 DIAGNOSIS — J189 Pneumonia, unspecified organism: Secondary | ICD-10-CM | POA: Diagnosis present

## 2015-09-19 DIAGNOSIS — M4850XA Collapsed vertebra, not elsewhere classified, site unspecified, initial encounter for fracture: Secondary | ICD-10-CM | POA: Diagnosis present

## 2015-09-19 DIAGNOSIS — I447 Left bundle-branch block, unspecified: Secondary | ICD-10-CM | POA: Diagnosis present

## 2015-09-19 DIAGNOSIS — Z880 Allergy status to penicillin: Secondary | ICD-10-CM

## 2015-09-19 DIAGNOSIS — E872 Acidosis: Secondary | ICD-10-CM | POA: Diagnosis present

## 2015-09-19 DIAGNOSIS — Z888 Allergy status to other drugs, medicaments and biological substances status: Secondary | ICD-10-CM | POA: Diagnosis not present

## 2015-09-19 DIAGNOSIS — I248 Other forms of acute ischemic heart disease: Secondary | ICD-10-CM | POA: Diagnosis present

## 2015-09-19 DIAGNOSIS — Z66 Do not resuscitate: Secondary | ICD-10-CM | POA: Diagnosis present

## 2015-09-19 DIAGNOSIS — Z515 Encounter for palliative care: Secondary | ICD-10-CM | POA: Diagnosis present

## 2015-09-19 DIAGNOSIS — R778 Other specified abnormalities of plasma proteins: Secondary | ICD-10-CM

## 2015-09-19 DIAGNOSIS — I509 Heart failure, unspecified: Secondary | ICD-10-CM

## 2015-09-19 LAB — LACTIC ACID, PLASMA: LACTIC ACID, VENOUS: 3.9 mmol/L — AB (ref 0.5–2.0)

## 2015-09-19 LAB — COMPREHENSIVE METABOLIC PANEL
ALT: 73 U/L — ABNORMAL HIGH (ref 14–54)
ANION GAP: 11 (ref 5–15)
AST: 131 U/L — ABNORMAL HIGH (ref 15–41)
Albumin: 3.7 g/dL (ref 3.5–5.0)
Alkaline Phosphatase: 42 U/L (ref 38–126)
BUN: 28 mg/dL — ABNORMAL HIGH (ref 6–20)
CHLORIDE: 107 mmol/L (ref 101–111)
CO2: 21 mmol/L — AB (ref 22–32)
Calcium: 9.5 mg/dL (ref 8.9–10.3)
Creatinine, Ser: 0.85 mg/dL (ref 0.44–1.00)
GFR calc non Af Amer: 57 mL/min — ABNORMAL LOW (ref >60)
Glucose, Bld: 177 mg/dL — ABNORMAL HIGH (ref 65–99)
POTASSIUM: 4.3 mmol/L (ref 3.5–5.1)
SODIUM: 139 mmol/L (ref 135–145)
Total Bilirubin: 1.3 mg/dL — ABNORMAL HIGH (ref 0.3–1.2)
Total Protein: 6 g/dL — ABNORMAL LOW (ref 6.5–8.1)

## 2015-09-19 LAB — CBC WITH DIFFERENTIAL/PLATELET
Basophils Absolute: 0 10*3/uL (ref 0–0.1)
Basophils Relative: 1 %
EOS ABS: 0 10*3/uL (ref 0–0.7)
EOS PCT: 0 %
HCT: 42.6 % (ref 35.0–47.0)
Hemoglobin: 13.8 g/dL (ref 12.0–16.0)
LYMPHS ABS: 0.3 10*3/uL — AB (ref 1.0–3.6)
Lymphocytes Relative: 4 %
MCH: 31.1 pg (ref 26.0–34.0)
MCHC: 32.4 g/dL (ref 32.0–36.0)
MCV: 96 fL (ref 80.0–100.0)
Monocytes Absolute: 0.3 10*3/uL (ref 0.2–0.9)
Monocytes Relative: 4 %
Neutro Abs: 6.9 10*3/uL — ABNORMAL HIGH (ref 1.4–6.5)
Neutrophils Relative %: 91 %
PLATELETS: 159 10*3/uL (ref 150–440)
RBC: 4.44 MIL/uL (ref 3.80–5.20)
RDW: 15 % — AB (ref 11.5–14.5)
WBC: 7.5 10*3/uL (ref 3.6–11.0)

## 2015-09-19 LAB — BRAIN NATRIURETIC PEPTIDE: B Natriuretic Peptide: 2773 pg/mL — ABNORMAL HIGH (ref 0.0–100.0)

## 2015-09-19 LAB — URINALYSIS COMPLETE WITH MICROSCOPIC (ARMC ONLY)
Bilirubin Urine: NEGATIVE
Glucose, UA: 50 mg/dL — AB
HGB URINE DIPSTICK: NEGATIVE
KETONES UR: NEGATIVE mg/dL
LEUKOCYTES UA: NEGATIVE
NITRITE: NEGATIVE
PROTEIN: 100 mg/dL — AB
pH: 5 (ref 5.0–8.0)

## 2015-09-19 LAB — PROTIME-INR
INR: 1.27
Prothrombin Time: 16 seconds — ABNORMAL HIGH (ref 11.4–15.0)

## 2015-09-19 LAB — TROPONIN I: Troponin I: 11.13 ng/mL — ABNORMAL HIGH (ref <0.031)

## 2015-09-19 MED ORDER — FERROUS SULFATE 325 (65 FE) MG PO TABS
325.0000 mg | ORAL_TABLET | Freq: Two times a day (BID) | ORAL | Status: DC
  Administered 2015-09-20 (×2): 325 mg via ORAL
  Filled 2015-09-19 (×2): qty 1

## 2015-09-19 MED ORDER — SODIUM CHLORIDE 0.9 % IV SOLN
500.0000 mg | Freq: Two times a day (BID) | INTRAVENOUS | Status: DC
  Administered 2015-09-19 – 2015-09-20 (×2): 500 mg via INTRAVENOUS
  Filled 2015-09-19 (×4): qty 0.5

## 2015-09-19 MED ORDER — HEPARIN (PORCINE) IN NACL 100-0.45 UNIT/ML-% IJ SOLN
12.0000 [IU]/kg/h | Freq: Once | INTRAMUSCULAR | Status: AC
  Administered 2015-09-19: 12 [IU]/kg/h via INTRAVENOUS
  Filled 2015-09-19: qty 250

## 2015-09-19 MED ORDER — SODIUM CHLORIDE 0.9 % IJ SOLN
3.0000 mL | Freq: Two times a day (BID) | INTRAMUSCULAR | Status: DC
  Administered 2015-09-20: 3 mL via INTRAVENOUS

## 2015-09-19 MED ORDER — ESCITALOPRAM OXALATE 10 MG PO TABS
10.0000 mg | ORAL_TABLET | Freq: Every day | ORAL | Status: DC
  Administered 2015-09-20: 10 mg via ORAL
  Filled 2015-09-19: qty 1

## 2015-09-19 MED ORDER — ASPIRIN EC 81 MG PO TBEC
81.0000 mg | DELAYED_RELEASE_TABLET | Freq: Every day | ORAL | Status: DC
  Administered 2015-09-20: 81 mg via ORAL
  Filled 2015-09-19: qty 1

## 2015-09-19 MED ORDER — ACETAMINOPHEN 650 MG RE SUPP: 650.0000 mg | Freq: Four times a day (QID) | RECTAL | Status: DC | PRN

## 2015-09-19 MED ORDER — SODIUM CHLORIDE 0.9 % IJ SOLN: 3.0000 mL | Freq: Two times a day (BID) | INTRAMUSCULAR | Status: DC

## 2015-09-19 MED ORDER — SODIUM CHLORIDE 0.9 % IV SOLN
Freq: Once | INTRAVENOUS | Status: AC
  Administered 2015-09-19: 12:00:00 via INTRAVENOUS

## 2015-09-19 MED ORDER — HEPARIN SODIUM (PORCINE) 5000 UNIT/ML IJ SOLN: 60.0000 [IU]/kg | Freq: Once | INTRAMUSCULAR | Status: DC

## 2015-09-19 MED ORDER — PANTOPRAZOLE SODIUM 40 MG IV SOLR
40.0000 mg | INTRAVENOUS | Status: DC
  Administered 2015-09-20: 40 mg via INTRAVENOUS
  Filled 2015-09-19: qty 40

## 2015-09-19 MED ORDER — ONDANSETRON HCL 4 MG PO TABS
4.0000 mg | ORAL_TABLET | Freq: Four times a day (QID) | ORAL | Status: DC | PRN
Start: 2015-09-19 — End: 2015-09-21

## 2015-09-19 MED ORDER — SODIUM CHLORIDE 0.9 % IJ SOLN: 3.0000 mL | INTRAMUSCULAR | Status: DC | PRN

## 2015-09-19 MED ORDER — VITAMIN D (ERGOCALCIFEROL) 1.25 MG (50000 UNIT) PO CAPS
50000.0000 [IU] | ORAL_CAPSULE | ORAL | Status: DC
  Administered 2015-09-20: 50000 [IU] via ORAL
  Filled 2015-09-19 (×2): qty 1

## 2015-09-19 MED ORDER — LEVOFLOXACIN 750 MG PO TABS
750.0000 mg | ORAL_TABLET | ORAL | Status: DC
  Administered 2015-09-20: 750 mg via ORAL
  Filled 2015-09-19: qty 1

## 2015-09-19 MED ORDER — HEPARIN BOLUS VIA INFUSION
1000.0000 [IU] | Freq: Once | INTRAVENOUS | Status: AC
  Administered 2015-09-19: 1000 [IU] via INTRAVENOUS
  Filled 2015-09-19: qty 1000

## 2015-09-19 MED ORDER — NITROGLYCERIN 0.4 MG SL SUBL
0.4000 mg | SUBLINGUAL_TABLET | SUBLINGUAL | Status: DC | PRN
  Filled 2015-09-19: qty 1

## 2015-09-19 MED ORDER — ONDANSETRON HCL 4 MG/2ML IJ SOLN
4.0000 mg | Freq: Four times a day (QID) | INTRAMUSCULAR | Status: DC | PRN
  Administered 2015-09-21: 10:00:00 4 mg via INTRAVENOUS
  Filled 2015-09-19: qty 2

## 2015-09-19 MED ORDER — SODIUM CHLORIDE 0.9 % IV SOLN: 1.0000 g | Freq: Three times a day (TID) | INTRAVENOUS | Status: DC

## 2015-09-19 MED ORDER — SODIUM CHLORIDE 0.9 % IV SOLN: 250.0000 mL | INTRAVENOUS | Status: DC | PRN

## 2015-09-19 MED ORDER — DONEPEZIL HCL 5 MG PO TABS
10.0000 mg | ORAL_TABLET | Freq: Every day | ORAL | Status: DC
  Administered 2015-09-20: 10 mg via ORAL
  Filled 2015-09-19: qty 2

## 2015-09-19 MED ORDER — IOHEXOL 350 MG/ML SOLN
50.0000 mL | Freq: Once | INTRAVENOUS | Status: AC | PRN
Start: 2015-09-19 — End: 2015-09-19
  Administered 2015-09-19: 50 mL via INTRAVENOUS

## 2015-09-19 MED ORDER — DOCUSATE SODIUM 100 MG PO CAPS
100.0000 mg | ORAL_CAPSULE | Freq: Two times a day (BID) | ORAL | Status: DC
  Administered 2015-09-20 (×2): 100 mg via ORAL
  Filled 2015-09-19 (×2): qty 1

## 2015-09-19 MED ORDER — VANCOMYCIN HCL IN DEXTROSE 1-5 GM/200ML-% IV SOLN
1000.0000 mg | Freq: Once | INTRAVENOUS | Status: AC
  Administered 2015-09-19: 1000 mg via INTRAVENOUS

## 2015-09-19 MED ORDER — ACETAMINOPHEN 325 MG PO TABS: 650.0000 mg | ORAL_TABLET | Freq: Four times a day (QID) | ORAL | Status: DC | PRN

## 2015-09-19 NOTE — Progress Notes (Signed)
ANTIBIOTIC CONSULT NOTE - INITIAL  Pharmacy Consult for meropenem Indication: bacteremia/pneumonia rule out  Allergies  Allergen Reactions  . Penicillins Other (See Comments)    Reaction:  Unknown   . Benadryl [Diphenhydramine] Swelling and Rash    Patient Measurements: Height: 4\' 8"  (142.2 cm) Weight: 75 lb (34.02 kg) IBW/kg (Calculated) : 36.3  Vital Signs: BP: 91/66 mmHg (11/16 1431) Pulse Rate: 83 (11/16 1431) Intake/Output from previous day:   Intake/Output from this shift:    Labs:  Recent Labs  09/18/15 1606 09/19/15 1155  WBC 7.3 7.5  HGB 14.1 13.8  PLT 171 159  CREATININE 0.63 0.85   Estimated Creatinine Clearance: 22.2 mL/min (by C-G formula based on Cr of 0.85). No results for input(s): VANCOTROUGH, VANCOPEAK, VANCORANDOM, GENTTROUGH, GENTPEAK, GENTRANDOM, TOBRATROUGH, TOBRAPEAK, TOBRARND, AMIKACINPEAK, AMIKACINTROU, AMIKACIN in the last 72 hours.   Microbiology: No results found for this or any previous visit (from the past 720 hour(s)).  Medical History: Past Medical History  Diagnosis Date  . Aortic stenosis   . Hypertension   . Depressed   . Syncope   . Ischemic colitis (HCC)     Medications:  Anti-infectives    Start     Dose/Rate Route Frequency Ordered Stop   09/19/15 1700  meropenem (MERREM) 500 mg in sodium chloride 0.9 % 50 mL IVPB     500 mg 100 mL/hr over 30 Minutes Intravenous Every 12 hours 09/19/15 1600     09/19/15 1515  vancomycin (VANCOCIN) IVPB 1000 mg/200 mL premix     1,000 mg 200 mL/hr over 60 Minutes Intravenous  Once 09/19/15 1506       Assessment: Pharmacy consulted to dose meropenem in this 81 year old female for possible bacteremia/pneumonia. Patient was treated for pneumonia in late October and reports worsening since. Patient has a PCN allergy listed with an unknown reaction.   Plan:  Will start meropenem 500 mg IV q12h based on indication and renal function Pharmacy will continue to monitor per protocol.  Thank you for the consult.  Jodelle RedMary M Louann Cantu 09/19/2015,4:01 PM

## 2015-09-19 NOTE — H&P (Signed)
[facility] [place] at [facility]   PATIENT NAME: Lisa Cantu    MR#:  [id number]  DATE OF BIRTH:  [birth year]  DATE OF ADMISSION:  09/19/2015  PRIMARY CARE PHYSICIAN:  REQUESTING/REFERRING PHYSICIAN: Dr. Mayford Knife  CHIEF COMPLAINT:  Generalized weakness  HISTORY OF PRESENT ILLNESS  Lisa Cantu  is a 82 y.o. female with a known history of aortic stenosis, hypertension and other medical problems is presenting to the ED with a chief complaint of generalized weakness. Patient lives alone and her family members come and help her but for the past few days she was unable to ambulate and take care of herself. During the last week of October patient was treated with some antibiotics for congestion and cough. Yesterday she came to the ED , she was diagnosed with pneumonia and discharged home with some antibiotics. As her situation is getting worse she came into the ED her troponin is elevated at 11.13. CT angiogram of the chest has not revealed any pulmonary embolism but showed pulmonary edema. According to the family member's patient is coughing up greenish -yellow phlegm  PAST MEDICAL HISTORY:   Past Medical History  Diagnosis Date  . Aortic stenosis   . Hypertension   . Depressed   . Syncope   . Ischemic colitis (HCC)     PAST SURGICAL HISTOIRY:  No past surgical history on file.  SOCIAL HISTORY:   Social History  Substance Use Topics  . Smoking status: Never Smoker   . Smokeless tobacco: Not on file  . Alcohol Use: No    FAMILY HISTORY:  No family history on file.  DRUG ALLERGIES:   Allergies  Allergen Reactions  . Penicillins Other (See Comments)    Reaction:  Unknown   . Benadryl [Diphenhydramine] Swelling and Rash    REVIEW OF SYSTEMS:  CONSTITUTIONAL: No fever, reporting fatigue or weakness.  EYES: No blurred or double vision.  EARS, NOSE, AND THROAT: No tinnitus or ear pain.  RESPIRATORY: Has productive cough with greenish  yellow phlegm, reporting vague chest discomfort. Denies shortness of breath, wheezing or hemoptysis.  CARDIOVASCULAR: No chest pain, orthopnea, edema.  GASTROINTESTINAL: No nausea, vomiting, diarrhea or abdominal pain.  GENITOURINARY: No dysuria, hematuria.  ENDOCRINE: No polyuria, nocturia,  HEMATOLOGY: No anemia, easy bruising or bleeding SKIN: No rash or lesion. MUSCULOSKELETAL: No joint pain or arthritis.   NEUROLOGIC: No tingling, numbness, weakness.  PSYCHIATRY: No anxiety or depression.   MEDICATIONS AT HOME:   Prior to Admission medications   Medication Sig Start Date End Date Taking? Authorizing Provider  alendronate (FOSAMAX) 70 MG tablet Take 70 mg by mouth once a week. Pt takes on Monday.   Take with a full glass of water on an empty stomach.   Yes Historical Provider, MD  aspirin EC 81 MG tablet Take 81 mg by mouth daily.   Yes Historical Provider, MD  Calcium Carbonate-Vitamin D (CALCIUM 600+D) 600-400 MG-UNIT tablet Take 1 tablet by mouth daily.   Yes Historical Provider, MD  donepezil (ARICEPT) 10 MG tablet Take 10 mg by mouth at bedtime.   Yes Historical Provider, MD  escitalopram (LEXAPRO) 10 MG tablet Take 10 mg by mouth at bedtime.   Yes Historical Provider, MD  ferrous sulfate 325 (65 FE) MG tablet Take 325 mg by mouth 2 (two) times daily.   Yes Historical Provider, MD  irbesartan (AVAPRO) 75 MG tablet Take 75 mg by mouth 2 (two) times daily.   Yes Historical Provider, MD  levofloxacin (LEVAQUIN) 750 MG tablet Take 1 tablet (750 mg total) by mouth daily. 09/18/15 09/25/15 Yes Phineas Semen, MD  Melatonin 1 MG TABS Take 1 mg by mouth at bedtime.   Yes Historical Provider, MD  Vitamin D, Ergocalciferol, (DRISDOL) 50000 UNITS CAPS capsule Take 50,000 Units by mouth every 7 (seven) days. Pt takes on Thursday.   Yes Historical Provider, MD      VITAL SIGNS:  Blood pressure 91/66, pulse 83, resp. rate 16, height  (1.422 m), weight 34.02 kg (75 lb), SpO2 99  %.  PHYSICAL EXAMINATION:  GENERAL:  81 y.o.-year-old patient lying in the bed with no acute distress.  EYES: Pupils equal, round, reactive to light and accommodation. No scleral icterus. Extraocular muscles intact.  HEENT: Head atraumatic, normocephalic. Oropharynx and nasopharynx clear.  NECK:  Supple, no jugular venous distention. No thyroid enlargement, no tenderness.  LUNGS: Moderate breath sounds bilaterally, positive rales and rhonchi, no wheezing,  No use of accessory muscles of respiration.  CARDIOVASCULAR: S1, S2 normal. Positive ejection systolic murmurs, no rubs, or gallops.  ABDOMEN: Soft, nontender, nondistended. Bowel sounds present. No organomegaly or mass.  EXTREMITIES: No pedal edema, cyanosis, or clubbing.  NEUROLOGIC: Cranial nerves II through XII are intact. Muscle strength below her baseline 3 out of 5 in all extremities. Sensation intact. Gait not checked.  PSYCHIATRIC: The patient is alert and oriented x 3.  SKIN: No obvious rash, lesion, or ulcer.   LABORATORY PANEL:   CBC  Recent Labs Lab 09/19/15 1155  WBC 7.5  HGB 13.8  HCT 42.6  PLT 159   ------------------------------------------------------------------------------------------------------------------  Chemistries   Recent Labs Lab 09/19/15 1155  NA 139  K 4.3  CL 107  CO2 21*  GLUCOSE 177*  BUN 28*  CREATININE 0.85  CALCIUM 9.5  AST 131*  ALT 73*  ALKPHOS 42  BILITOT 1.3*   ------------------------------------------------------------------------------------------------------------------  Cardiac Enzymes  Recent Labs Lab 09/19/15 1155  TROPONINI 11.13*   ------------------------------------------------------------------------------------------------------------------  RADIOLOGY:  Dg Chest 2 View  09/18/2015  CLINICAL DATA:  Headache, chest congestion and generalized weakness. EXAM: CHEST  2 VIEW COMPARISON:  09/14/2014 FINDINGS: Lungs are adequately inflated with consolidation  over the left lower lobe likely pneumonia. Small amount left pleural fluid. Mild stable cardiomegaly. Stable spinal compression fractures. Moderate degenerative changes of the spine. Calcified plaque over the thoracoabdominal aorta. IMPRESSION: Consolidation over the left lower lobe likely pneumonia. Small left effusion. Electronically Signed   By: Elberta Fortis M.D.   On: 09/18/2015 17:52   Ct Angio Chest Pe W/cm &/or Wo Cm  09/19/2015  CLINICAL DATA:  Increased weakness. Cough for 1 month. Evaluate for pulmonary embolism. EXAM: CT ANGIOGRAPHY CHEST WITH CONTRAST TECHNIQUE: Multidetector CT imaging of the chest was performed using the standard protocol during bolus administration of intravenous contrast. Multiplanar CT image reconstructions and MIPs were obtained to evaluate the vascular anatomy. CONTRAST:  50mL OMNIPAQUE IOHEXOL 350 MG/ML SOLN COMPARISON:  Chest radiograph -09/18/2015; 06/29/2013 FINDINGS: Vascular Findings: There is adequate opacification of the pulmonary arterial system with the main pulmonary artery measuring 438 Hounsfield units. There are no discrete filling defects within the pulmonary arterial tree to suggest pulmonary embolism. Normal caliber of the main pulmonary artery. Cardiomegaly. Coronary artery calcifications. Calcifications within the aortic valve leaflets. No pericardial effusion. Scattered calcified atherosclerotic plaque within a normal caliber thoracic aorta. The descending thoracic aorta is tortuous but of normal caliber. No definite evidence of thoracic aortic dissection on this nongated examination. Conventional configuration of the  aortic arch. Review of the MIP images confirms the above findings. ---------------------------------------------------------------------------------- Nonvascular Findings: Interval development of small to moderate-sized bilateral pleural effusions, left greater than right, with associated dependent atelectasis/collapse, more conspicuously  involving the left lower lobe with associated air bronchograms. These findings are associated with mild diffuse interlobular septal thickening, most conspicuous within the bilateral lung apices. There is a minimal amount of nondependent debris seen within the right mainstem bronchus (image 45, series 2). There is occlusion of several right medial basilar lower lobe segmental and subsegmental bronchi. The remaining central pulmonary airways appear widely patent. No pneumothorax. No definite pulmonary nodules given background parenchymal abnormalities. Scattered mediastinal and right hilar lymph nodes are individually not enlarged by size criteria with index right precarinal lymph node measuring 0.9 cm in greatest short axis diameter (is 41, series 4 and index right suprahilar lymph node measuring 0.7 cm (image 49). No definitive mediastinal, hilar axillary lymphadenopathy. Limited early arterial phase evaluation of the upper abdomen demonstrates reflux of contrast into the intrahepatic venous system, suggestive of right-sided heart failure. Post cement augmentation of the T10 and L2 vertebral bodies. Moderate to severe compression deformity involving the T8 vertebral body is grossly unchanged since the 06/2013 chest radiograph. Age-indeterminate severe severe compression deformities involving the T3 vertebral body without definitive associated fracture line or paraspinal hematoma. IMPRESSION: 1. No evidence of pulmonary embolism. 2. Cardiomegaly with findings of pulmonary edema with reflux of contrast into the intrahepatic venous system, small to moderate-sized effusions and associated partial atelectasis/collapse of the bilateral lower lobes, left greater than right. 3. Calcifications within the aortic valve leaflets. Further evaluation with cardiac echo could be performed as clinically indicated. 4. Coronary artery calcifications. 5. Post cement augmentation of the T10 and L2 vertebral bodies. 6. Grossly unchanged  moderate to severe compression deformity involving the T8 vertebral body, stable since the 06/2013 chest radiograph. 7. Age-indeterminate (though presumably chronic) severe compression deformity involving the T3 vertebral body. Correlation for point tenderness at this location is recommended. Electronically Signed   By: Simonne ComeJohn  Watts M.D.   On: 09/19/2015 13:07    EKG:   Orders placed or performed during the hospital encounter of 09/19/15  . ED EKG  . ED EKG    IMPRESSION AND PLAN:  Lisa Cantu  is a 81 y.o. female with a known history of aortic stenosis, hypertension and other medical problems is presenting to the ED with a chief complaint of generalized weakness. Patient lives alone and her family members come and help her but for the past few days she was unable to ambulate and take care of herself. During the last week of October patient was treated with some antibiotics for congestion and cough. Yesterday she came to the ED , she was diagnosed with pneumonia and discharged home with some antibiotics. As her situation is getting worse she came into the ED her troponin is elevated at 11.13. CT angiogram of the chest has not revealed any pulmonary embolism but showed pulmonary edema.  Assessment and plan  1 non-STEMI Troponin is at 11.7 EKG with no significant changes We'll cycle cardiac biomarkers Patient is started on heparin drip, will follow the protocol Provide her baby aspirin and check fasting lipid panel Patient is not a candidate for beta blocker as she is hypotensive We will give her nitroglycerin as needed basis for chest pain Cardiac consult is placed and notified to Dr. Lady GaryfATH  2. Sepsis-meets septic criteria with hypotension and elevated lactic acid Could be probably from healthcare  associated pneumonia, patient was just recently treated with antibiotics for congestion during and of October but patient clinically is not improved We will treat the patient with meropenem and  vancomycin Blood cultures, urine culture and sputum culture and sensitivity is ordered. Urine for Legionella antigen and streptococcal antigen were ordered Could not give any IV fluids in view of pulmonary edema  3. History of severe aortic stenosis The patient is  not a surgical candidate per cardiology Will continue closely monitoring the patient  4. Acute on chronic congestive heart failure with pulmonary edema  Currently patient is hypotensive, cannot give any Lasix or other diuretics  5. T3 vertebral compression fracture-chronic Provide pain management as needed basis  Will provide GI prophylaxis with Protonix  All the records are reviewed and case discussed with ED provider. Management plans discussed with the patient, family and they are in agreement.  CODE STATUS: DO NOT RESUSCITATE , patient's nieces and nephews Consuello Masse, Ray Kimsey, and Chastain Mel R healthcare power of attorney  TOTAL CRITICAL CARE TIME TAKING CARE OF THIS PATIENT: 45 minutes.    Ramonita Lab M.D on 09/19/2015 at 3:17 PM  Between 7am to 6pm - Pager - [phone]  After 6pm go to [email or web address] - password [email or web address] [facility]  [facility]  Office  [phone]  CC: Primary care physician; No primary care provider on file.

## 2015-09-19 NOTE — ED Notes (Signed)
Pt seen here yesterday and d/c with pneumonia. Pt reports increased weakness today, unable to walk or care for herself like she normally can. Unable to obtain oral temp in triage. Nephew brought pt in, pt lives at home by self.

## 2015-09-19 NOTE — ED Provider Notes (Signed)
[facility] Emergency Department Provider Note     Time seen: ----------------------------------------- 12:04 PM on 09/19/2015 -----------------------------------------    I have reviewed the triage vital signs and the nursing notes.   HISTORY  Chief Complaint Weakness    HPI Lisa Cantu is a 81 y.o. female who presents to ER for increased weakness to the point where she can't even be helped to ambulate. Recently she was seen and diagnosed with pneumonia, the nephew brought the patient in as she lives at home by herself. She's not had any fevers or chills, complains of persistent weakness with shortness of breath, cough and diarrhea.   Past Medical History  Diagnosis Date  . Aortic stenosis   . Hypertension   . Depressed   . Syncope   . Ischemic colitis (HCC)     There are no active problems to display for this patient.   No past surgical history on file.  Allergies Penicillins  Social History Social History  Substance Use Topics  . Smoking status: Never Smoker   . Smokeless tobacco: Not on file  . Alcohol Use: No    Review of Systems Constitutional: Negative for fever. Eyes: Negative for visual changes. ENT: Negative for sore throat. Cardiovascular: Negative for chest pain. Respiratory: Positive for shortness of breath and cough Gastrointestinal: Negative for abdominal pain, vomiting and positive for diarrhea Genitourinary: Negative for dysuria. Musculoskeletal: Negative for back pain. Skin: Negative for rash. Neurological: Negative for headaches, focal weakness, positive for weakness  10-point ROS otherwise negative.  ____________________________________________   PHYSICAL EXAM:  VITAL SIGNS: ED Triage Vitals  Enc Vitals Group     BP 09/19/15 1132 106/66 mmHg     Pulse Rate 09/19/15 1132 80     Resp 09/19/15 1132 16     Temp --      Temp src --      SpO2 09/19/15 1132 100 %     Weight 09/19/15 1132 75 lb  (34.02 kg)     Height 09/19/15 1132  (1.422 m)     Head Cir --      Peak Flow --      Pain Score 09/19/15 1133 6     Pain Loc --      Pain Edu? --      Excl. in GC? --     Constitutional: Alert and oriented. Well appearing and in no distress. Eyes: Conjunctivae are normal. PERRL. Normal extraocular movements. ENT   Head: Normocephalic and atraumatic.   Nose: No congestion/rhinnorhea.   Mouth/Throat: Mucous membranes are moist.   Neck: No stridor. Cardiovascular: Normal rate, regular rhythm. Normal and symmetric distal pulses are present in all extremities. Slight systolic murmur. Respiratory: Normal respiratory effort without tachypnea nor retractions. Scattered rales and rhonchi. Gastrointestinal: Soft and nontender. No distention. No abdominal bruits.  Musculoskeletal: Nontender with normal range of motion in all extremities. No joint effusions.  No lower extremity tenderness nor edema. Rectal: Heme negative stool, nontender Neurologic:  Normal speech and language. No gross focal neurologic deficits are appreciated. Speech is normal. No gait instability. Skin:  Skin is warm, dry and intact. No rash noted. Psychiatric: Mood and affect are normal. Speech and behavior are normal. Patient exhibits appropriate insight and judgment. ____________________________________________  EKG: Interpreted by me. Normal sinus rhythm rate 87 bpm, left bundle branch block, leftward axis, LVH  ____________________________________________  ED COURSE:  Pertinent labs & imaging results that were available during my care of the patient were reviewed  by me and considered in my medical decision making (see chart for details). Patient with unexplained weakness, will check basic labs and reevaluate. ____________________________________________    LABS (pertinent positives/negatives)  Labs Reviewed  CBC WITH DIFFERENTIAL/PLATELET - Abnormal; Notable for the following:    RDW 15.0 (*)     Neutro Abs 6.9 (*)    Lymphs Abs 0.3 (*)    All other components within normal limits  COMPREHENSIVE METABOLIC PANEL - Abnormal; Notable for the following:    CO2 21 (*)    Glucose, Bld 177 (*)    BUN 28 (*)    Total Protein 6.0 (*)    AST 131 (*)    ALT 73 (*)    Total Bilirubin 1.3 (*)    GFR calc non Af Amer 57 (*)    All other components within normal limits  LACTIC ACID, PLASMA - Abnormal; Notable for the following:    Lactic Acid, Venous 3.9 (*)    All other components within normal limits  BRAIN NATRIURETIC PEPTIDE - Abnormal; Notable for the following:    B Natriuretic Peptide 2773.0 (*)    All other components within normal limits  TROPONIN I - Abnormal; Notable for the following:    Troponin I 11.13 (*)    All other components within normal limits  CULTURE, BLOOD (ROUTINE X 2)  CULTURE, BLOOD (ROUTINE X 2)  URINALYSIS COMPLETEWITH MICROSCOPIC ([facility] ONLY)    RADIOLOGY Images were viewed by me  CT chest IMPRESSION: 1. No evidence of pulmonary embolism. 2. Cardiomegaly with findings of pulmonary edema with reflux of contrast into the intrahepatic venous system, small to moderate-sized effusions and associated partial atelectasis/collapse of the bilateral lower lobes, left greater than right. 3. Calcifications within the aortic valve leaflets. Further evaluation with cardiac echo could be performed as clinically indicated. 4. Coronary artery calcifications. 5. Post cement augmentation of the T10 and L2 vertebral bodies. 6. Grossly unchanged moderate to severe compression deformity involving the T8 vertebral body, stable since the 06/2013 chest radiograph. 7. Age-indeterminate (though presumably chronic) severe compression deformity involving the T3 vertebral body. Correlation for point tenderness at this location is recommended.  CRITICAL CARE Performed by: Emily FilbertWilliams, Alexia Dinger E   Total critical care time: 30 minutes  Critical care time was exclusive of  separately billable procedures and treating other patients.  Critical care was necessary to treat or prevent imminent or life-threatening deterioration.  Critical care was time spent personally by me on the following activities: development of treatment plan with patient and/or surrogate as well as nursing, discussions with consultants, evaluation of patient's response to treatment, examination of patient, obtaining history from patient or surrogate, ordering and performing treatments and interventions, ordering and review of laboratory studies, ordering and review of radiographic studies, pulse oximetry and re-evaluation of patient's condition.  ____________________________________________  FINAL ASSESSMENT AND PLAN  Weakness, NSTEMI, lactic acidosis, pleural effusions  Plan: Patient with labs and imaging as dictated above. Patient with non-ST elevation MI of unclear etiology. This is likely cause for her weakness. I will discuss with cardiology, should be placed on a heparin drip. Condition is guarded at this time, blood pressure is 90 systolic  Emily FilbertWilliams, Jakory Matsuo E, MD   Emily FilbertJonathan E Mela Perham, MD 09/19/15 [phone]

## 2015-09-19 NOTE — Progress Notes (Signed)
ANTICOAGULATION CONSULT NOTE - Initial Consult  Pharmacy Consult for heparin gtt Indication: chest pain/ACS  Allergies  Allergen Reactions  . Penicillins Other (See Comments)    Patient Measurements: Height: 4\' 8"  (142.2 cm) Weight: 75 lb (34.02 kg) IBW/kg (Calculated) : 36.3 Heparin Dosing Weight: 34 kg (patient's actual weight)  Vital Signs: BP: 92/68 mmHg (11/16 1400) Pulse Rate: 84 (11/16 1400)  Labs:  Recent Labs  09/18/15 1606 09/19/15 1155  HGB 14.1 13.8  HCT 43.4 42.6  PLT 171 159  CREATININE 0.63 0.85  TROPONINI  --  11.13*    Estimated Creatinine Clearance: 22.2 mL/min (by C-G formula based on Cr of 0.85).   Medical History: Past Medical History  Diagnosis Date  . Aortic stenosis   . Hypertension   . Depressed   . Syncope   . Ischemic colitis [facility](HCC)     Assessment: Pharmacy consulted to dose heparin gtt for chest pain/ACS in this 81 year old female presenting to the ED with complaints of weakness with an elevated troponin of 11.13.  Per documentation and med reconciliation, patient was not taking any anticoagulants prior to admission.   Baseline labs (CBC, APTT, and protime/INR) have been collected and are pending.   Goal of Therapy:  Heparin level 0.3-0.7 units/ml Monitor platelets by anticoagulation protocol: Yes   Plan:  Give 1000 units bolus x 1 Start heparin infusion at 400 units/hr Check anti-Xa level in 8 hours and daily while on heparin Continue to monitor H&H and platelets  Jodelle RedMary M Ellenor Wisniewski 09/19/2015,2:31 PM

## 2015-09-19 NOTE — ED Notes (Signed)
Pt desat with activity and increased work or breathing, placing on bed pan, O2 increased to 3L [place], O2 100%, activity stopped.

## 2015-09-19 NOTE — Consult Note (Signed)
[facility]  CARDIOLOGY CONSULT NOTE  Patient ID: Lisa Cantu MRN: [id number] DOB/AGE: 79-Jan-1921 79 y.o.  Admit date: 09/19/2015 Referring Physician Gouru Primary Physician   Primary Cardiologist paraschos Reason for Consultation abnormal troponin  HPI: 81 yo female with history of severe aortic stenosis treated medically who presented to the er with complaints of several days of weakness, fatigue and decreased mental status. In the er troponin was drawn which came bak at 11.13. She complains of weakness and fatigue. She has lightheadedness but is not active. She has not been felt to be an ideal candidate for avr. She has been compliant with her meds. She is currently hemodynamically stable  ROS Review of Systems - History obtained from chart review and the patient Respiratory ROS: positive for - shortness of breath Cardiovascular ROS: positive for - chest pain and dyspnea on exertion Gastrointestinal ROS: no abdominal pain, change in bowel habits, or black or bloody stools Musculoskeletal ROS: negative Neurological ROS: no TIA or stroke symptoms   Past Medical History  Diagnosis Date  . Aortic stenosis   . Hypertension   . Depressed   . Syncope   . Ischemic colitis (HCC)     No family history on file.  Social History   Social History  . Marital Status: Single    Spouse Name: N/A  . Number of Children: N/A  . Years of Education: N/A   Occupational History  . Not on file.   Social History Main Topics  . Smoking status: Never Smoker   . Smokeless tobacco: Not on file  . Alcohol Use: No  . Drug Use: Not on file  . Sexual Activity: Not on file   Other Topics Concern  . Not on file   Social History Narrative    No past surgical history on file.    (Not in a hospital admission)  Physical Exam: Blood pressure 103/77, pulse 87, resp. rate 24, height 4\' 8"  (1.422 m), weight 34.02 kg (75 lb), SpO2 100 %.    General  appearance: cooperative and fatigued Resp: diminished breath sounds bibasilar Chest wall: no tenderness Cardio: regular rate and rhythm and systolic murmur: late systolic 3/6, crescendo and decrescendo at lower left sternal border GI: soft, non-tender; bowel sounds normal; no masses,  no organomegaly Extremities: extremities normal, atraumatic, no cyanosis or edema Labs:   Lab Results  Component Value Date   WBC 7.5 09/19/2015   HGB 13.8 09/19/2015   HCT 42.6 09/19/2015   MCV 96.0 09/19/2015   PLT 159 09/19/2015    Recent Labs Lab 09/19/15 1155  NA 139  K 4.3  CL 107  CO2 21*  BUN 28*  CREATININE 0.85  CALCIUM 9.5  PROT 6.0*  BILITOT 1.3*  ALKPHOS 42  ALT 73*  AST 131*  GLUCOSE 177*   Lab Results  Component Value Date   CKMB 1.9 09/14/2014   TROPONINI 11.13* 09/19/2015      Radiology: probable lll pneumonia with small left effusion EKG: nsr  ASSESSMENT AND PLAN:  81 yo female with history of aortic stenosis admitted wth weaknes and fatigue. Elevated serum torponin . May be piromary ischmeia vs demand ischemia in face of severe aortic stenosis and peumonia. Will treat medically as you are doing with iv abx and heparin. Follow hemodynamics and electrolytes. Will attempt to treat medially and aavoid invasive evaluation if possible Signed: Dalia HeadingFATH,Fryda Molenda A. MD, [facility] 09/19/2015, 9:30 PM

## 2015-09-20 LAB — COMPREHENSIVE METABOLIC PANEL
ALBUMIN: 3.2 g/dL — AB (ref 3.5–5.0)
ALK PHOS: 37 U/L — AB (ref 38–126)
ALT: 81 U/L — AB (ref 14–54)
AST: 192 U/L — AB (ref 15–41)
Anion gap: 10 (ref 5–15)
BILIRUBIN TOTAL: 1.2 mg/dL (ref 0.3–1.2)
BUN: 39 mg/dL — AB (ref 6–20)
CO2: 18 mmol/L — ABNORMAL LOW (ref 22–32)
CREATININE: 0.97 mg/dL (ref 0.44–1.00)
Calcium: 9 mg/dL (ref 8.9–10.3)
Chloride: 111 mmol/L (ref 101–111)
GFR calc Af Amer: 57 mL/min — ABNORMAL LOW (ref >60)
GFR, EST NON AFRICAN AMERICAN: 49 mL/min — AB (ref >60)
GLUCOSE: 117 mg/dL — AB (ref 65–99)
Potassium: 4.8 mmol/L (ref 3.5–5.1)
Sodium: 139 mmol/L (ref 135–145)
TOTAL PROTEIN: 5.5 g/dL — AB (ref 6.5–8.1)

## 2015-09-20 LAB — LIPID PANEL
CHOL/HDL RATIO: 2.7 ratio
Cholesterol: 175 mg/dL (ref 0–200)
HDL: 66 mg/dL (ref >40)
LDL CALC: 94 mg/dL (ref 0–99)
TRIGLYCERIDES: 73 mg/dL (ref <150)
VLDL: 15 mg/dL (ref 0–40)

## 2015-09-20 LAB — CBC WITH DIFFERENTIAL/PLATELET
BASOS PCT: 0 %
Basophils Absolute: 0 10*3/uL (ref 0–0.1)
Eosinophils Absolute: 0 10*3/uL (ref 0–0.7)
Eosinophils Relative: 0 %
HEMATOCRIT: 40.1 % (ref 35.0–47.0)
HEMOGLOBIN: 13.5 g/dL (ref 12.0–16.0)
Lymphocytes Relative: 4 %
Lymphs Abs: 0.3 10*3/uL — ABNORMAL LOW (ref 1.0–3.6)
MCH: 32 pg (ref 26.0–34.0)
MCHC: 33.6 g/dL (ref 32.0–36.0)
MCV: 95.1 fL (ref 80.0–100.0)
MONOS PCT: 8 %
Monocytes Absolute: 0.7 10*3/uL (ref 0.2–0.9)
NEUTROS ABS: 8.2 10*3/uL — AB (ref 1.4–6.5)
NEUTROS PCT: 88 %
Platelets: 157 10*3/uL (ref 150–440)
RBC: 4.22 MIL/uL (ref 3.80–5.20)
RDW: 15.2 % — ABNORMAL HIGH (ref 11.5–14.5)
WBC: 9.3 10*3/uL (ref 3.6–11.0)

## 2015-09-20 LAB — GLUCOSE, CAPILLARY: Glucose-Capillary: 99 mg/dL (ref 65–99)

## 2015-09-20 LAB — HEPARIN LEVEL (UNFRACTIONATED)
HEPARIN UNFRACTIONATED: 0.36 [IU]/mL (ref 0.30–0.70)
HEPARIN UNFRACTIONATED: 0.14 [IU]/mL — AB (ref 0.30–0.70)

## 2015-09-20 LAB — TROPONIN I
TROPONIN I: 24.07 ng/mL — AB (ref <0.031)
Troponin I: 25.03 ng/mL — ABNORMAL HIGH (ref <0.031)
Troponin I: 23.84 ng/mL — ABNORMAL HIGH (ref <0.031)

## 2015-09-20 LAB — STREP PNEUMONIAE URINARY ANTIGEN: Strep Pneumo Urinary Antigen: NEGATIVE

## 2015-09-20 MED ORDER — VANCOMYCIN HCL 500 MG IV SOLR: 500.0000 mg | INTRAVENOUS | Status: DC

## 2015-09-20 MED ORDER — LORAZEPAM 2 MG/ML IJ SOLN: 1.0000 mg | INTRAMUSCULAR | Status: DC | PRN

## 2015-09-20 MED ORDER — LORAZEPAM 2 MG/ML IJ SOLN: 5.0000 mg/h | INTRAVENOUS | Status: DC

## 2015-09-20 MED ORDER — MORPHINE BOLUS VIA INFUSION: 5.0000 mg | INTRAVENOUS | Status: DC | PRN

## 2015-09-20 MED ORDER — MORPHINE SULFATE 25 MG/ML IV SOLN: 10.0000 mg/h | INTRAVENOUS | Status: DC

## 2015-09-20 MED ORDER — LORAZEPAM BOLUS VIA INFUSION: 2.0000 mg | INTRAVENOUS | Status: DC | PRN

## 2015-09-20 MED ORDER — HEPARIN (PORCINE) IN NACL 100-0.45 UNIT/ML-% IJ SOLN
12.0000 [IU]/kg/h | INTRAMUSCULAR | Status: DC
  Administered 2015-09-20: 12 [IU]/kg/h via INTRAVENOUS
  Filled 2015-09-20: qty 250

## 2015-09-20 MED ORDER — MORPHINE SULFATE (PF) 2 MG/ML IV SOLN
2.0000 mg | INTRAVENOUS | Status: DC | PRN
  Administered 2015-09-21: 2 mg via INTRAVENOUS
  Filled 2015-09-20: qty 1

## 2015-09-20 NOTE — Progress Notes (Signed)
ANTICOAGULATION CONSULT NOTE - Follow up Consult  Pharmacy Consult for heparin gtt Indication: chest pain/ACS  Allergies  Allergen Reactions  . Penicillins Other (See Comments)    Reaction:  Unknown   . Benadryl [Diphenhydramine] Swelling and Rash    Patient Measurements: Height: 4\' 8"  (142.2 cm) Weight: 78 lb 6.4 oz (35.562 kg) IBW/kg (Calculated) : 36.3 Heparin Dosing Weight: 34 kg (patient's actual weight)  Vital Signs: Temp: 98.9 F (37.2 C) (11/17 1105) Temp Source: Oral (11/17 0309) BP: 95/63 mmHg (11/17 1105) Pulse Rate: 86 (11/17 1105)  Labs:  Recent Labs  09/18/15 1606 09/19/15 1155 09/19/15 1532 09/20/15 0052 09/20/15 0523 09/20/15 0905  HGB 14.1 13.8  --   --  13.5  --   HCT 43.4 42.6  --   --  40.1  --   PLT 171 159  --   --  157  --   LABPROT  --   --  16.0*  --   --   --   INR  --   --  1.27  --   --   --   HEPARINUNFRC  --   --   --  0.14*  --  0.36  CREATININE 0.63 0.85  --   --  0.97  --   TROPONINI  --  11.13*  --  24.07* 25.03*  --     Estimated Creatinine Clearance: 20.4 mL/min (by C-G formula based on Cr of 0.97).   Medical History: Past Medical History  Diagnosis Date  . Aortic stenosis   . Hypertension   . Depressed   . Syncope   . Ischemic colitis [facility](HCC)     Assessment: Pharmacy consulted to dose heparin gtt for chest pain/ACS in this 81 year old female presenting to the ED with complaints of weakness with an elevated troponin of 11.13. Medically treating NSTEMI per cards note.   Per documentation and med reconciliation, patient was not taking any anticoagulants prior to admission.    Goal of Therapy:  Heparin level 0.3-0.7 units/ml Monitor platelets by anticoagulation protocol: Yes   Plan:  Give 1000 units bolus x 1 Start heparin infusion at 400 units/hr Check anti-Xa level in 8 hours and daily while on heparin Continue to monitor H&H and platelets  11/17: anti-Xa level at 0905 = 0.36 within goal range. Hgb/Plt  relatively stable. Will continue current rate.  Will recheck level in 8h to confirm that it remains therapeutic - 11/17 at 1700 CBC in AM  Crist FatWang, Zeki Bedrosian L 09/20/2015,11:50 AM

## 2015-09-20 NOTE — Progress Notes (Signed)
[facility] [place] [facility]   PATIENT NAME: Lisa Cantu    MR#:  [id number]  DATE OF BIRTH:  1920/01/23  SUBJECTIVE:  CHIEF COMPLAINT:   Chief Complaint  Patient presents with  . Weakness  actively dying. Family at bedside. Shallow breathing  REVIEW OF SYSTEMS:  Review of Systems  Unable to perform ROS: acuity of condition   DRUG ALLERGIES:   Allergies  Allergen Reactions  . Penicillins Other (See Comments)    Reaction:  Unknown   . Benadryl [Diphenhydramine] Swelling and Rash   VITALS:  Blood pressure 95/63, pulse 86, temperature 97.6 F (36.4 C), temperature source Axillary, resp. rate 18, height  (1.422 m), weight 35.562 kg (78 lb 6.4 oz), SpO2 99 %. PHYSICAL EXAMINATION:  Physical Exam  Constitutional: She appears lethargic and malnourished. She appears unhealthy. She appears cachectic. She appears toxic. She has a sickly appearance. She appears distressed.  HENT:  Head: Normocephalic and atraumatic.  Eyes: Conjunctivae and EOM are normal. Pupils are equal, round, and reactive to light.  Neck: Normal range of motion. Neck supple. No tracheal deviation present. No thyromegaly present.  Cardiovascular: Normal rate, regular rhythm and normal heart sounds.   Pulmonary/Chest: Effort normal and breath sounds normal. No respiratory distress. She has no wheezes. She exhibits no tenderness.  Abdominal: Soft. Bowel sounds are normal. She exhibits no distension. There is no tenderness.  Musculoskeletal: Normal range of motion.  Neurological: She appears lethargic. No cranial nerve deficit.  lethargic  Skin: Skin is warm and dry. No rash noted.  Psychiatric: Mood and affect normal.  Actively dying    LABORATORY PANEL:   CBC  Recent Labs Lab 09/20/15 0523  WBC 9.3  HGB 13.5  HCT 40.1  PLT 157   ------------------------------------------------------------------------------------------------------------------ Chemistries    Recent Labs Lab 09/20/15 0523  NA 139  K 4.8  CL 111  CO2 18*  GLUCOSE 117*  BUN 39*  CREATININE 0.97  CALCIUM 9.0  AST 192*  ALT 81*  ALKPHOS 37*  BILITOT 1.2   RADIOLOGY:  No results found. ASSESSMENT AND PLAN:  Lisa Cantu is a 81 y.o. female with a known history of aortic stenosis, hypertension and other medical problems is presenting to the ED with a chief complaint of generalized weakness. Patient lives alone and her family members come and help her but for the past few days she was unable to ambulate and take care of herself. During the last week of October patient was treated with some antibiotics for congestion and cough. Yesterday she came to the ED , she was diagnosed with pneumonia and discharged home with some antibiotics. As her situation is getting worse she came into the ED her troponin is elevated at 11.13. CT angiogram of the chest has not revealed any pulmonary embolism but showed pulmonary edema.  1 NSTEMI: d/w family and they are all agreeable with comfort care only which is right thing to do for this 81 yr old person.  2. Sepsis-present on admission. Now comfort care only  3. History of severe aortic stenosis: appreciate Cardio input. Not a surgical candidate  4. Acute on chronic systolic congestive heart failure with pulmonary edema:   5. T3 vertebral compression fracture-chronic   Will provide GI prophylaxis with Protonix     All the records are reviewed and case discussed with Care Management/Social Worker. Management plans discussed with the patient, family and they are in agreement.  CODE STATUS: DNR  TOTAL TIME TAKING  CARE OF THIS PATIENT: 35 minutes.   More than 50% of the time was spent in counseling/coordination of care: YES  POSSIBLE D/C IN AM, DEPENDING ON CLINICAL CONDITION. She is comfort care only at this point. May die overnight. If survives and appropriate for Hospice - will consider transfer Hospice Home.   [facility], Hellon Vaccarella  M.D on 09/20/2015 at 4:28 PM  Between 7am to 6pm - Pager - [phone]  After 6pm go to [email or web address] - password [email or web address] [facility]  [facility]  Office  [phone]  CC: Primary care physician; No primary care provider on file.

## 2015-09-20 NOTE — Progress Notes (Signed)
ANTIBIOTIC CONSULT NOTE - Follow up  Pharmacy Consult for meropenem and vancomycin Indication: bacteremia/pneumonia rule out  Allergies  Allergen Reactions  . Penicillins Other (See Comments)    Reaction:  Unknown   . Benadryl [Diphenhydramine] Swelling and Rash    Patient Measurements: Height: 4\' 8"  (142.2 cm) Weight: 78 lb 6.4 oz (35.562 kg) IBW/kg (Calculated) : 36.3  Vital Signs: Temp: 98.9 F (37.2 C) (11/17 1105) Temp Source: Oral (11/17 0309) BP: 95/63 mmHg (11/17 1105) Pulse Rate: 86 (11/17 1105) Intake/Output from previous day:   Intake/Output from this shift: Total I/O In: 360 [P.O.:360] Out: -   Labs:  Recent Labs  09/18/15 1606 09/19/15 1155 09/20/15 0523  WBC 7.3 7.5 9.3  HGB 14.1 13.8 13.5  PLT 171 159 157  CREATININE 0.63 0.85 0.97   Estimated Creatinine Clearance: 20.4 mL/min (by C-G formula based on Cr of 0.97). No results for input(s): VANCOTROUGH, VANCOPEAK, VANCORANDOM, GENTTROUGH, GENTPEAK, GENTRANDOM, TOBRATROUGH, TOBRAPEAK, TOBRARND, AMIKACINPEAK, AMIKACINTROU, AMIKACIN in the last 72 hours.   Microbiology: No results found for this or any previous visit (from the past 720 hour(s)).  Medical History: Past Medical History  Diagnosis Date  . Aortic stenosis   . Hypertension   . Depressed   . Syncope   . Ischemic colitis (HCC)     Medications:  Anti-infectives    Start     Dose/Rate Route Frequency Ordered Stop   09/21/15 1200  vancomycin (VANCOCIN) 500 mg in sodium chloride 0.9 % 100 mL IVPB  Status:  Discontinued     500 mg 100 mL/hr over 60 Minutes Intravenous Every 48 hours 09/20/15 0432 09/20/15 0432   09/21/15 1200  vancomycin (VANCOCIN) 500 mg in sodium chloride 0.9 % 100 mL IVPB     500 mg 100 mL/hr over 60 Minutes Intravenous Every 48 hours 09/20/15 0432     09/20/15 0100  levofloxacin (LEVAQUIN) tablet 750 mg     750 mg Oral Every 48 hours 09/19/15 2301     09/19/15 2315  meropenem (MERREM) 1 g in sodium chloride 0.9 %  100 mL IVPB  Status:  Discontinued     1 g 200 mL/hr over 30 Minutes Intravenous 3 times per day 09/19/15 2301 09/19/15 2355   09/19/15 1700  meropenem (MERREM) 500 mg in sodium chloride 0.9 % 50 mL IVPB     500 mg 100 mL/hr over 30 Minutes Intravenous Every 12 hours 09/19/15 1600     09/19/15 1515  vancomycin (VANCOCIN) IVPB 1000 mg/200 mL premix     1,000 mg 200 mL/hr over 60 Minutes Intravenous  Once 09/19/15 1506 09/19/15 1829     Assessment: Pharmacy consulted to dose meropenem in this 81 year old female for possible bacteremia/pneumonia. Patient was treated for pneumonia in late October and reports worsening since. Patient has a PCN allergy listed with an unknown reaction.   Plan:  Will continue meropenem 500 mg IV q12h based on indication and renal function  Vancomycin TBW 35.6kg  IBW 36.4kg  DW 35.6kg  Vd 25L kei 0.023 hr-1  T1/2 30 hours 500 mg q 36 hours ordered with stacked dosing after initial dose of 1 gram.  Level before 4th dose.  11/17: vanc currently ordered for q48h. Will change order to 500 IV q36h. VT 11/19 at 2330 before 3rd overall dose not at Css. SCr in AM to continue to follow renal fxn.    Pharmacy will continue to monitor per protocol. Thank you for the consult.  Crist FatWang, Domani Bakos  L 09/20/2015,12:02 PM

## 2015-09-20 NOTE — Progress Notes (Signed)
Patient was admitted from the ER via a stretcher. On admission she was A&O X4, and has her daughter at her bedside. Ad mission documentation was completed and patient and daughter were  oriented to the unit. Patient denied pain and N&V. She was assisted with bed movement with a pillow support Q2H and as needed. Her skin was checked with Lexi RN, skin was intact. Patient has heparin infusing overnight per order. She remained ST in the 100s on the monitor with stable VS.

## 2015-09-20 NOTE — Care Management (Signed)
Patient presents from home  with weakness.  Admitted with sepsis and also found to have had a significant mi with troponins up to 25.  She also has diagnosis of severe aortic stenosis.  Patient has been made comfort care.  Palliative care is involved

## 2015-09-20 NOTE — Progress Notes (Signed)
   09/20/15 1600  Clinical Encounter Type  Visited With Patient  Visit Type Initial  Referral From Nurse  Consult/Referral To Chaplain  Provided pastoral presence and support to patient.  Pt was unable to converse with me very much due to her throat being dry and pt appeared to be a bit confused.  She did tell me that she felt that she is "heading towards the end".  Pt did not identify any needs at this time, but I asked pt to contact me if she needed me.  Pt thanked me for coming to visit with her.  Asbury Automotive GroupChaplain Glynn Freas-pager [phone]

## 2015-09-20 NOTE — Progress Notes (Signed)
Dr. Clint GuyHower notified about patient's c/o SOB at rest. O2sat at 99%. Waiting for order.

## 2015-09-20 NOTE — Progress Notes (Signed)
Pt with critical aortic stenosis admitted with progressive sob and abnormal serum troponin to 25 c/w nstemi. CXR suggests pneumonia. Probable aspiration pneumonia. Has systollic chf as well as the pneumonia. Very poor prognosis. Not candidate for invasive evaluation or savr or tavr. Agree with comfort measures.

## 2015-09-20 NOTE — Progress Notes (Addendum)
Patient and family asking if patient's dog may visit with pt as she will be on comfort care. Nursing supervisor called and directed call to Lisa Cantu who gave approval, as long as we are careful and very aware and dog stays in room. RN asked for family to provide vaccination records and family confirmed pt animal is house trained. Comfort care orders are in; charge RN aware. Telemetry discontinued per order.

## 2015-09-20 NOTE — Evaluation (Addendum)
Clinical/Bedside Swallow Evaluation Patient Details  Name: Lisa Cantu M Gilday MRN: [id number] Date of Birth: 1920-02-20  Today's Date: 09/20/2015 Time: SLP Start Time (ACUTE ONLY): 1300 SLP Stop Time (ACUTE ONLY): 1400 SLP Time Calculation (min) (ACUTE ONLY): 60 min  Past Medical History:  Past Medical History  Diagnosis Date  . Aortic stenosis   . Hypertension   . Depressed   . Syncope   . Ischemic colitis [facility](HCC)    Past Surgical History: History reviewed. No pertinent past surgical history. HPI: Pt is a 81 y.o. female with a known history of aortic stenosis, hypertension and other medical problems is presenting to the ED with a chief complaint of generalized weakness. Patient lives alone and her family members come and help her but for the past few days she was unable to ambulate and take care of herself. During the last week of October patient was treated with some antibiotics for congestion and cough. Yesterday she came to the ED , she was diagnosed with pneumonia and discharged home with some antibiotics. As her situation is getting worse she came into the ED her troponin is elevated at 11.13. CT angiogram of the chest has not revealed any pulmonary embolism but showed pulmonary edema. NSG reported pt has been coughing w/ thin liquids during the morning but has tolerated thickened consistency liquids when given.      Assessment / Plan / Recommendation Clinical Impression   Pt appeared to present w/ mild-moderate dysphagia w/ exhibited overt s/s of aspiration w/ trials of thin liquids per NSG report. Using strict aspiration precautions and strategies, pt was able to tolerate few trials of thin liquids via cup w/ SLP. Pt was also able to tolerate trials of puree and Nectar consistency liquids w/ no overt s/s of aspiration noted following general aspiration precautions. Due to pt's declined medical and respiratory status' at this time, rec. a Dysphagia diet w/ Nectar liquids during meals w/  pleasure ice chips or small sips of water following the strict aspiration precautions and strategies as practiced to reduce risk for aspiration. Pt and family stated understanding of pt's increased risk for aspiration of thin liquids at this time but would like for pt to have pleasure po's if she wants. Will discuss w/ NSG and MD for such order.        Aspiration Risk    Mild-Moderate   Diet Recommendation   Dys. 2 w/ Nectar liquids  Medication Administration: Whole meds with puree    Other  Recommendations Oral Care Recommendations: Oral care BID;Staff/trained caregiver to provide oral care Other Recommendations: Order thickener from pharmacy;Prohibited food (jello, ice cream, thin soups);Remove water pitcher   Follow up Recommendations   (TBD)    Frequency and Duration min 3x week  1 week       Swallow Study   General      Oral/Motor/Sensory Function   appeared wfl  Ice Chips   5 trials(single)  Thin Liquid   6 trials via cup w/ slight chin tuck; small; single   Nectar Thick   3 trials via cup; small; single  Honey Thick   NT  Puree   3 trials via tsp (half)  Solid   NT     Jerilynn SomKatherine Watson, MS, CCC-SLP  Watson,Katherine 09/20/2015,3:53 PM

## 2015-09-20 NOTE — Consult Note (Signed)
Palliative Care consult initiated with patient's niece Lisa Cantu who is present in patient's room. Patient in bed, unresponsive and with shallow breathing noted. Patient has been made comfort care only at this time and per discussion with Dr. Sherryll BurgerShah, patient is actively dying. Unsure at this time of whether or not patient is stable enough to leave the hospital. Lisa BondsIda states that [facility] are not present at the hospital at this. Patient's niece [place], but has been communicating by phone for updates. Lisa Cantu, patient's nephew, who is also stated to be HCPOA per Lisa Cantu's report, has been visiting and should be present tomorrow. Family aware of patient's poor prognosis and are all in agreement with DNR and comfort care only. Plan at this time will be for Palliative Care to follow-up tomorrow morning, should patient survive the night and be stable for discharge. Family in agreement with this plan. Lise AuerNan Green, RN care manager and Dr. Sherryll BurgerShah updated and are also in agreement.  [place]il[place] ShiEmily Cantu, MSW, LCSW Palliative Care social worker [phone] (c)

## 2015-09-20 NOTE — Clinical Documentation Improvement (Addendum)
Cardiology Internal Medicine  Can the diagnosis of acute on chronic heart failure be further specified?    Type - Systolic, Diastolic, Systolic and Diastolic  Other  Clinically Undetermined  Please exercise your independent, professional judgment when responding. A specific answer is not anticipated or expected.Please update your documentation within the medical record to reflect your response to this query.  Chronic Diastolic CHF - EF 55%  Thank you, Doy MinceVangela Swofford, RN [phone] Clinical Documentation Specialist

## 2015-09-20 NOTE — Progress Notes (Signed)
ANTIBIOTIC CONSULT NOTE - INITIAL  Pharmacy Consult for meropenem and vancomycin Indication: bacteremia/pneumonia rule out  Allergies  Allergen Reactions  . Penicillins Other (See Comments)    Reaction:  Unknown   . Benadryl [Diphenhydramine] Swelling and Rash    Patient Measurements: Height: 4\' 8"  (142.2 cm) Weight: 78 lb 6.4 oz (35.562 kg) IBW/kg (Calculated) : 36.3  Vital Signs: Temp: 97.5 F (36.4 C) (11/17 0309) Temp Source: Oral (11/17 0309) BP: 96/60 mmHg (11/17 0309) Pulse Rate: 87 (11/17 0309) Intake/Output from previous day:   Intake/Output from this shift:    Labs:  Recent Labs  09/18/15 1606 09/19/15 1155  WBC 7.3 7.5  HGB 14.1 13.8  PLT 171 159  CREATININE 0.63 0.85   Estimated Creatinine Clearance: 23.2 mL/min (by C-G formula based on Cr of 0.85). No results for input(s): VANCOTROUGH, VANCOPEAK, VANCORANDOM, GENTTROUGH, GENTPEAK, GENTRANDOM, TOBRATROUGH, TOBRAPEAK, TOBRARND, AMIKACINPEAK, AMIKACINTROU, AMIKACIN in the last 72 hours.   Microbiology: No results found for this or any previous visit (from the past 720 hour(s)).  Medical History: Past Medical History  Diagnosis Date  . Aortic stenosis   . Hypertension   . Depressed   . Syncope   . Ischemic colitis (HCC)     Medications:  Anti-infectives    Start     Dose/Rate Route Frequency Ordered Stop   09/21/15 1200  vancomycin (VANCOCIN) 500 mg in sodium chloride 0.9 % 100 mL IVPB  Status:  Discontinued     500 mg 100 mL/hr over 60 Minutes Intravenous Every 48 hours 09/20/15 0432 09/20/15 0432   09/21/15 1200  vancomycin (VANCOCIN) 500 mg in sodium chloride 0.9 % 100 mL IVPB     500 mg 100 mL/hr over 60 Minutes Intravenous Every 48 hours 09/20/15 0432     09/20/15 0100  levofloxacin (LEVAQUIN) tablet 750 mg     750 mg Oral Every 48 hours 09/19/15 2301     09/19/15 2315  meropenem (MERREM) 1 g in sodium chloride 0.9 % 100 mL IVPB  Status:  Discontinued     1 g 200 mL/hr over 30 Minutes  Intravenous 3 times per day 09/19/15 2301 09/19/15 2355   09/19/15 1700  meropenem (MERREM) 500 mg in sodium chloride 0.9 % 50 mL IVPB     500 mg 100 mL/hr over 30 Minutes Intravenous Every 12 hours 09/19/15 1600     09/19/15 1515  vancomycin (VANCOCIN) IVPB 1000 mg/200 mL premix     1,000 mg 200 mL/hr over 60 Minutes Intravenous  Once 09/19/15 1506 09/19/15 1829     Assessment: Pharmacy consulted to dose meropenem in this 81 year old female for possible bacteremia/pneumonia. Patient was treated for pneumonia in late October and reports worsening since. Patient has a PCN allergy listed with an unknown reaction.   Plan:  Will start meropenem 500 mg IV q12h based on indication and renal function  Vancomycin TBW 35.6kg  IBW 36.4kg  DW 35.6kg  Vd 25L kei 0.023 hr-1  T1/2 30 hours 500 mg q 36 hours ordered with stacked dosing after initial dose of 1 gram.  Level before 4th dose.   Pharmacy will continue to monitor per protocol. Thank you for the consult.  McBane,Matthew S 09/20/2015,4:33 AM

## 2015-09-20 NOTE — Progress Notes (Signed)
Vaccine records provided; copy made and are on chart.

## 2015-09-21 LAB — LEGIONELLA PNEUMOPHILA SEROGP 1 UR AG: L. PNEUMOPHILA SEROGP 1 UR AG: NEGATIVE

## 2015-09-21 MED ORDER — MORPHINE SULFATE (PF) 2 MG/ML IV SOLN: 2.0000 mg | INTRAVENOUS | Status: DC | PRN

## 2015-09-21 MED ORDER — LORAZEPAM 0.5 MG PO TABS: 0.5000 mg | ORAL_TABLET | ORAL | Status: AC | PRN

## 2015-09-21 MED ORDER — MORPHINE SULFATE (CONCENTRATE) 20 MG/ML PO SOLN: 5.0000 mg | ORAL | Status: AC | PRN

## 2015-09-21 MED ORDER — LORAZEPAM 2 MG/ML IJ SOLN: 1.0000 mg | INTRAMUSCULAR | Status: DC | PRN

## 2015-09-21 NOTE — Plan of Care (Signed)
Problem: Education: Goal: Knowledge of the prescribed therapeutic regimen will improve Outcome: Progressing Pt alert at times. Discussed what to expect w/ pt now being comfort care regarding pain management and comfort in the dying process. Pt family verbalized understanding and are supportive in process.   Problem: Coping: Goal: Ability to identify and develop effective coping behavior will improve Outcome: Progressing Niece at bedside, very supportive and willing to do whatever is best for pt. Good coping skills utilized.   Problem: Respiratory: Goal: Verbalizations of increased ease of respirations will increase Outcome: Progressing Unlabored breathing, shallow breaths. No need for PRN medications throughout the night. Resting peacefully.   Problem: Pain Management: Goal: General experience of comfort will improve Outcome: Progressing No c/o pain, resting in bed comfortably through the night.

## 2015-09-21 NOTE — Discharge Summary (Addendum)
[facility] [place] [facility]   PATIENT NAME: Lisa Cantu    MR#:  [id number]  DATE OF BIRTH:  06/05/1920  DATE OF ADMISSION:  09/19/2015 ADMITTING PHYSICIAN: Ramonita LabAruna Gouru, MD  DATE OF DISCHARGE: 09/21/2015   PRIMARY CARE PHYSICIAN: Elmo PuttWalker, John B III MD   ADMISSION DIAGNOSIS:  NSTEMI (non-ST elevated myocardial infarction) (HCC) [I21.4] Elevated troponin I level [R79.89] Acute congestive heart failure, unspecified congestive heart failure type (HCC) [I50.9]  DISCHARGE DIAGNOSIS:  Active Problems:   Non-STEMI (non-ST elevated myocardial infarction) (HCC)   NSTEMI (non-ST elevated myocardial infarction) (HCC) Chronic Diastolic CHF (EF on recent echo 55%)  SECONDARY DIAGNOSIS:   Past Medical History  Diagnosis Date  . Aortic stenosis   . Hypertension   . Depressed   . Syncope   . Ischemic colitis [facility](HCC)    HOSPITAL COURSE:  81 y.o. female with a known history of aortic stenosis, hypertension and other medical problems is admitted with generalized weakness.  Please see Dr. Rob HickmanGouru's dictated history and physical for further details.  Patient was ruled in for non-ST elevation MI with troponin peak of 25.  Cardiology consultation was obtained who recommended conservative management.  Family was in agreement.  Knowing overall poor prognosis.  Family discussion was held and decision was made to keep her comfortable only and she is being transferred to hospice home as per her and their wishes. DISCHARGE CONDITIONS:  fair CONSULTS OBTAINED:  Treatment Team:  Dalia HeadingKenneth A Fath, MD  DRUG ALLERGIES:   Allergies  Allergen Reactions  . Penicillins Other (See Comments)    Reaction:  Unknown   . Benadryl [Diphenhydramine] Swelling and Rash    DISCHARGE MEDICATIONS:   Current Discharge Medication List    START taking these medications   Details  LORazepam (ATIVAN) 0.5 MG tablet Take 1 tablet (0.5 mg total) by mouth every 4 (four) hours as needed  for anxiety. Qty: 30 tablet, Refills: 0    morphine (ROXANOL) 20 MG/ML concentrated solution Take 0.25 mLs (5 mg total) by mouth every 2 (two) hours as needed for moderate pain, severe pain, breakthrough pain, anxiety or shortness of breath. Qty: 30 mL, Refills: 0      STOP taking these medications     alendronate (FOSAMAX) 70 MG tablet      aspirin EC 81 MG tablet      Calcium Carbonate-Vitamin D (CALCIUM 600+D) 600-400 MG-UNIT tablet      donepezil (ARICEPT) 10 MG tablet      escitalopram (LEXAPRO) 10 MG tablet      ferrous sulfate 325 (65 FE) MG tablet      irbesartan (AVAPRO) 75 MG tablet      levofloxacin (LEVAQUIN) 750 MG tablet      Melatonin 1 MG TABS      Vitamin D, Ergocalciferol, (DRISDOL) 50000 UNITS CAPS capsule        DISCHARGE INSTRUCTIONS:    DIET:  Regular diet  DISCHARGE CONDITION:  Critical  ACTIVITY:  Bedrest  OXYGEN:  Home Oxygen: Yes.     Oxygen Delivery: 2 liters/min via Patient connected to nasal cannula oxygen  DISCHARGE LOCATION:  Hospice home   If you experience worsening of your admission symptoms, develop shortness of breath, life threatening emergency, suicidal or homicidal thoughts you must seek medical attention immediately by calling 911 or calling your MD immediately  if symptoms less severe.  You Must read complete instructions/literature along with all the possible adverse reactions/side effects for all the Medicines  you take and that have been prescribed to you. Take any new Medicines after you have completely understood and accpet all the possible adverse reactions/side effects.   Please note  You were cared for by a hospitalist during your hospital stay. If you have any questions about your discharge medications or the care you received while you were in the hospital after you are discharged, you can call the unit and asked to speak with the hospitalist on call if the hospitalist that took care of you is not available.  Once you are discharged, your primary care physician will handle any further medical issues. Please note that NO REFILLS for any discharge medications will be authorized once you are discharged, as it is imperative that you return to your primary care physician (or establish a relationship with a primary care physician if you do not have one) for your aftercare needs so that they can reassess your need for medications and monitor your lab values.    On the day of Discharge:   VITAL SIGNS:  Blood pressure 96/66, pulse 90, temperature 97.8 F (36.6 C), temperature source Oral, resp. rate 18, height  (1.422 m), weight 35.562 kg (78 lb 6.4 oz), SpO2 100 %. PHYSICAL EXAMINATION:  Constitutional: She appears lethargic and malnourished. She appears unhealthy. She appears cachectic. She appears toxic. She has a sickly appearance. She appears distressed.  HENT:  Head: Normocephalic and atraumatic.  Eyes: Conjunctivae and EOM are normal. Pupils are equal, round, and reactive to light.  Neck: Normal range of motion. Neck supple. No tracheal deviation present. No thyromegaly present.  Cardiovascular: Normal rate, regular rhythm and normal heart sounds.  Pulmonary/Chest: Effort normal and breath sounds normal. No respiratory distress. She has no wheezes. She exhibits no tenderness.  Abdominal: Soft. Bowel sounds are normal. She exhibits no distension. There is no tenderness.  Musculoskeletal: Normal range of motion.  Neurological: She appears lethargic. No cranial nerve deficit.  lethargic  Skin: Skin is warm and dry. No rash noted.  Psychiatric: Mood and affect normal.  Actively dying  DATA REVIEW:   CBC  Recent Labs Lab 09/20/15 0523  WBC 9.3  HGB 13.5  HCT 40.1  PLT 157    Chemistries   Recent Labs Lab 09/20/15 0523  NA 139  K 4.8  CL 111  CO2 18*  GLUCOSE 117*  BUN 39*  CREATININE 0.97  CALCIUM 9.0  AST 192*  ALT 81*  ALKPHOS 37*  BILITOT 1.2    Cardiac  Enzymes  Recent Labs Lab 09/20/15 1109  TROPONINI 23.84*    Microbiology Results  Results for orders placed or performed during the hospital encounter of 09/19/15  Blood culture (routine x 2)     Status: None (Preliminary result)   Collection Time: 09/19/15  2:22 PM  Result Value Ref Range Status   Specimen Description BLOOD RIGHT ASSIST CONTROL  Final   Special Requests BOTTLES DRAWN AEROBIC AND ANAEROBIC 4CC  Final   Culture NO GROWTH 2 DAYS  Final   Report Status PENDING  Incomplete  Blood culture (routine x 2)     Status: None (Preliminary result)   Collection Time: 09/19/15  3:32 PM  Result Value Ref Range Status   Specimen Description BLOOD LEFT ARM  Final   Special Requests BOTTLES DRAWN AEROBIC AND ANAEROBIC 4CC  Final   Culture NO GROWTH 2 DAYS  Final   Report Status PENDING  Incomplete  Urine culture     Status: None (Preliminary result)  Collection Time: 09/20/15  7:45 AM  Result Value Ref Range Status   Specimen Description URINE, CLEAN CATCH  Final   Special Requests NONE  Final   Culture   Final    >=100,000 COLONIES/mL YEAST IDENTIFICATION TO FOLLOW    Report Status PENDING  Incomplete    RADIOLOGY:  Ct Angio Chest Pe W/cm &/or Wo Cm  09/19/2015  CLINICAL DATA:  Increased weakness. Cough for 1 month. Evaluate for pulmonary embolism. EXAM: CT ANGIOGRAPHY CHEST WITH CONTRAST TECHNIQUE: Multidetector CT imaging of the chest was performed using the standard protocol during bolus administration of intravenous contrast. Multiplanar CT image reconstructions and MIPs were obtained to evaluate the vascular anatomy. CONTRAST:  50mL OMNIPAQUE IOHEXOL 350 MG/ML SOLN COMPARISON:  Chest radiograph -09/18/2015; 06/29/2013 FINDINGS: Vascular Findings: There is adequate opacification of the pulmonary arterial system with the main pulmonary artery measuring 438 Hounsfield units. There are no discrete filling defects within the pulmonary arterial tree to suggest pulmonary  embolism. Normal caliber of the main pulmonary artery. Cardiomegaly. Coronary artery calcifications. Calcifications within the aortic valve leaflets. No pericardial effusion. Scattered calcified atherosclerotic plaque within a normal caliber thoracic aorta. The descending thoracic aorta is tortuous but of normal caliber. No definite evidence of thoracic aortic dissection on this nongated examination. Conventional configuration of the aortic arch. Review of the MIP images confirms the above findings. ---------------------------------------------------------------------------------- Nonvascular Findings: Interval development of small to moderate-sized bilateral pleural effusions, left greater than right, with associated dependent atelectasis/collapse, more conspicuously involving the left lower lobe with associated air bronchograms. These findings are associated with mild diffuse interlobular septal thickening, most conspicuous within the bilateral lung apices. There is a minimal amount of nondependent debris seen within the right mainstem bronchus (image 45, series 2). There is occlusion of several right medial basilar lower lobe segmental and subsegmental bronchi. The remaining central pulmonary airways appear widely patent. No pneumothorax. No definite pulmonary nodules given background parenchymal abnormalities. Scattered mediastinal and right hilar lymph nodes are individually not enlarged by size criteria with index right precarinal lymph node measuring 0.9 cm in greatest short axis diameter (is 41, series 4 and index right suprahilar lymph node measuring 0.7 cm (image 49). No definitive mediastinal, hilar axillary lymphadenopathy. Limited early arterial phase evaluation of the upper abdomen demonstrates reflux of contrast into the intrahepatic venous system, suggestive of right-sided heart failure. Post cement augmentation of the T10 and L2 vertebral bodies. Moderate to severe compression deformity involving  the T8 vertebral body is grossly unchanged since the 06/2013 chest radiograph. Age-indeterminate severe severe compression deformities involving the T3 vertebral body without definitive associated fracture line or paraspinal hematoma. IMPRESSION: 1. No evidence of pulmonary embolism. 2. Cardiomegaly with findings of pulmonary edema with reflux of contrast into the intrahepatic venous system, small to moderate-sized effusions and associated partial atelectasis/collapse of the bilateral lower lobes, left greater than right. 3. Calcifications within the aortic valve leaflets. Further evaluation with cardiac echo could be performed as clinically indicated. 4. Coronary artery calcifications. 5. Post cement augmentation of the T10 and L2 vertebral bodies. 6. Grossly unchanged moderate to severe compression deformity involving the T8 vertebral body, stable since the 06/2013 chest radiograph. 7. Age-indeterminate (though presumably chronic) severe compression deformity involving the T3 vertebral body. Correlation for point tenderness at this location is recommended. Electronically Signed   By: Simonne Come M.D.   On: 09/19/2015 13:07     Management plans discussed with the patient, family and they are in agreement.  CODE STATUS: DO  NOT RESUSCITATE  TOTAL TIME TAKING CARE OF THIS PATIENT: 55 minutes.    [facility], Annelie Boak M.D on 09/21/2015 at 11:37 AM  Between 7am to 6pm - Pager - [phone]  After 6pm go to [email or web address] - password [email or web address] [facility]  [place]  Office  [phone]  CC: Primary care physician;  Rafael Bihari MD [place]

## 2015-09-21 NOTE — Care Management Important Message (Signed)
Important Message  Patient Details  Name: Lisa Cantu MRN: [id number] Date of Birth: 1920/04/17   Medicare Important Message Given:       Chapman FitchBOWEN, Christain Niznik T, RN 09/21/2015, 10:08 AM

## 2015-09-21 NOTE — Discharge Instructions (Signed)
Hospice °Hospice is a service that is designed to provide people who are terminally ill and their families with medical, spiritual, and psychological support. Its aim is to improve your quality of life by keeping you as alert and comfortable as possible. Hospice is performed by a team of health care professionals and volunteers who: °· Help keep you comfortable. Hospice can be provided in your home or in a homelike setting. The hospice staff works with your family and friends to help meet your needs. You will enjoy the support of loved ones by receiving much of your basic care from family and friends. °· Provide pain relief and manage your symptoms. The staff supply all necessary medicines and equipment. °· Provide companionship when you are alone. °· Allow you and your family to rest. They may do light housekeeping, prepare meals, and run errands. °· Provide counseling. They will make sure your emotional, spiritual, and social needs and those of your family are being met. °· Provide spiritual care. Spiritual care is individualized to meet your needs and your family's needs. It may involve helping you look at what death means to you, say goodbye, or perform a specific religious ceremony or ritual. °Hospice teams often include: °· A nurse. °· A doctor. °· Social workers. °· Religious leaders (such as a chaplain). °· Trained volunteers. °WHEN SHOULD HOSPICE CARE BEGIN? °Most people who use hospice are believed to have fewer than 6 months to live. Your family and health care providers can help you decide when hospice services should begin. If your condition improves, you may discontinue the program. °WHAT SHOULD I CONSIDER BEFORE SELECTING A PROGRAM? °Most hospice programs are run by nonprofit, independent organizations. Some are affiliated with hospitals, nursing homes, or home health care agencies. Hospice programs can take place in the home or at a hospice center, hospital, or skilled nursing facility. When choosing  a hospice program, ask the following questions: °· What services are available to me? °· What services are offered to my loved ones? °· How involved are my loved ones? °· How involved is my health care provider? °· Who makes up the hospice care team? How are they trained or screened? °· How will my pain and symptoms be managed? °· If my circumstances change, can the services be provided in a different setting, such as my home or in the hospital? °· Is the program reviewed and licensed by the state or certified in some other way? °WHERE CAN I LEARN MORE ABOUT HOSPICE? °You can learn about existing hospice programs in your area from your health care providers. You can also read more about hospice online. The websites of the following organizations contain helpful information: °· The National Hospice and Palliative Care Organization (NHPCO). °· The Hospice Association of America (HAA). °· The Hospice Education Institute. °· The American Cancer Society (ACS). °· Hospice Net. °  °This information is not intended to replace advice given to you by your health care provider. Make sure you discuss any questions you have with your health care provider. °  °Document Released: 02/06/2004 Document Revised: 10/25/2013 Document Reviewed: 08/30/2013 °Elsevier Interactive Patient Education ©2016 Elsevier Inc. ° °

## 2015-09-21 NOTE — Consult Note (Signed)
Palliative Care consult completed with patient and family. Patient is currently comfort care only and is a DNR. She is awake and alert this morning and able to answer questions appropriately. Discussed patient's desire to die a natural, comfortable death and at this time, she desires end-of-life care at the Hospice Home. Patient previously living home alone and independent prior to this hospitalization. Multiple family members present for this discussion including nieces and nephews Fredia Sorrowda, Janice, and Ray. [facility]'s nephew, who is planning to come to the hospital to meet with Dayna BarkerKaren Robertson, Hospice RN Liaison to complete transfer to Hospice Home. All family members in agreement with this plan. Dr. Sherryll BurgerShah, Judeth CornfieldStephanie, and Dede QuerySarah McNulty, CSW updated. Dr. Sherryll BurgerShah completing  discharge information at this time.   [place] ShiEmily Howell, MSW, LCSW Palliative Care social worker  [phone] (c)

## 2015-09-21 NOTE — Clinical Social Work Note (Signed)
Pt is ready for discharge today to [facility]. Hospital liaison made arrangements and CSW prepared discharge packet. [facility]. CSW is signing off as no further needs identified.   Dede QuerySarah Concettina Leth, MSW, LCSW Clinical Social Worker  [phone]

## 2015-09-21 NOTE — Progress Notes (Signed)
[place] referral received from [place] following a Palliative Care consult. Ms. Chai is a 81 year old who came to [facility] for evaluation of increased weakness, she was found to have pneumonia and also a NON STEMI, elevated troponin. Due to her critical aortic stenosis she in not a candidate for any invasive procedures. Patient and her family have met with Palliative Care and have chosen for comfort and EOL care at the hospice home. Patient seen lying in bed, easily engaged with Probation officer, expressed her desire to go to the hospice home, re[ported chest pain, staff RN Taylor notified. Writer met in the family room with patient's nephew Mel, nieces Tonia Ghent and 2 other family members. Education initiated regarding hospice services, philosophy and team approach to care with good understanding voiced by all. Family, patient and hospital care team all aware of and in agreement with plan for discharge to the hospice home today with a portable DNR in place. Report called to hospice home,. EMS notified for pick up. Thank you for the opportunity to be involved in the care of this patient and her family.  Flo Shanks RN, BSN, [place] [facility] [place], hospital Liaison [phone]

## 2015-09-21 NOTE — Progress Notes (Signed)
Pt discharged with hospice services. Resting comfortably. IV removed. Patient left via EMS.

## 2015-09-21 NOTE — Care Management (Signed)
Patient to discharge today to Hospice Home.  Lisa Cantu from palliative facilitating transfer.  No RNCM needs.  Will assist if needed

## 2015-09-22 LAB — URINE CULTURE: Culture: >=100000

## 2015-09-26 LAB — CULTURE, BLOOD (ROUTINE X 2)
CULTURE: NO GROWTH
Culture: NO GROWTH

## 2015-10-04 DEATH — deceased
